# Patient Record
Sex: Male | Born: 1965 | Race: Black or African American | Hispanic: No | Marital: Single | Smoking: Former smoker
Health system: Southern US, Community
[De-identification: ages and names within clinical notes are randomized; demographics above are authoritative.]

---

## 1999-05-21 ENCOUNTER — Emergency Department (HOSPITAL_COMMUNITY): Admission: EM | Admit: 1999-05-21 | Discharge: 1999-05-21 | Payer: Self-pay | Admitting: Emergency Medicine

## 2001-07-04 ENCOUNTER — Encounter: Payer: Self-pay | Admitting: Emergency Medicine

## 2001-07-04 ENCOUNTER — Emergency Department (HOSPITAL_COMMUNITY): Admission: EM | Admit: 2001-07-04 | Discharge: 2001-07-04 | Payer: Self-pay | Admitting: Emergency Medicine

## 2006-06-27 ENCOUNTER — Emergency Department (HOSPITAL_COMMUNITY): Admission: EM | Admit: 2006-06-27 | Discharge: 2006-06-27 | Payer: Self-pay | Admitting: Emergency Medicine

## 2007-08-17 ENCOUNTER — Emergency Department (HOSPITAL_COMMUNITY): Admission: EM | Admit: 2007-08-17 | Discharge: 2007-08-17 | Payer: Self-pay | Admitting: Emergency Medicine

## 2011-04-27 ENCOUNTER — Encounter: Payer: Self-pay | Admitting: Family Medicine

## 2011-04-27 ENCOUNTER — Ambulatory Visit (INDEPENDENT_AMBULATORY_CARE_PROVIDER_SITE_OTHER): Payer: 59 | Admitting: Family Medicine

## 2011-04-27 VITALS — BP 134/82 | HR 96 | Temp 98.1°F | Resp 18 | Ht 69.0 in | Wt 146.0 lb

## 2011-04-27 DIAGNOSIS — Z0000 Encounter for general adult medical examination without abnormal findings: Secondary | ICD-10-CM | POA: Insufficient documentation

## 2011-04-27 DIAGNOSIS — Z720 Tobacco use: Secondary | ICD-10-CM | POA: Insufficient documentation

## 2011-04-27 DIAGNOSIS — L723 Sebaceous cyst: Secondary | ICD-10-CM

## 2011-04-27 NOTE — Patient Instructions (Signed)
Epidermal Cyst An epidermal cyst is usually a small, painless lump under the skin. Cysts often occur on the face, neck, stomach, chest, or genitals. The cyst may be filled with a bad smelling paste. Do not pop your cyst. Popping the cyst can cause pain and puffiness (swelling). HOME CARE   Only take medicines as told by your doctor.   Take your medicine (antibiotics) as told. Finish it even if you start to feel better.  GET HELP RIGHT AWAY IF:  Your cyst is tender, red, or puffy.   You are not getting better, or you are getting worse.   You have any questions or concerns.  MAKE SURE YOU:  Understand these instructions.   Will watch your condition.   Will get help right away if you are not doing well or get worse.  Document Released: 02/16/2004 Document Revised: 12/28/2010 Document Reviewed: 07/17/2010 Exit[employer] Patient Information 2012 [employer]. 

## 2011-04-27 NOTE — Progress Notes (Signed)
  Subjective:    Patient ID: Shane Daniel, male    DOB: 1965/04/24, 46 y.o.   MRN: [id number]  HPI  This 46 y.o. AA male is here for evaluation of a knot on the left side of his neck which has been  present for 2 years. It was looked at by another physician who told him it was a cyst that could easily be cut  open and material expressed; the pt wants the lesion to be removed permanently sothat it does not come back  He had a smaller lesion on the left side of his face near his eye years ago; he applied a topical salve to the area  with complete resolution. He has been squeezing this knot, trying to "pop it". There has been no discharge  from this lesion.    Review of Systems  Constitutional: Negative for fever and chills.  HENT: Negative for sore throat, trouble swallowing, neck pain and neck stiffness.   Skin: Negative for rash and wound.  Neurological: Negative.        Objective:   Physical Exam  Vitals reviewed. Constitutional: He is oriented to person, place, and time. He appears well-developed and well-nourished. No distress.  HENT:  Head: Normocephalic and atraumatic.  Right Ear: External ear normal.  Left Ear: External ear normal.  Mouth/Throat: Oropharynx is clear and moist.  Eyes: Conjunctivae and EOM are normal. No scleral icterus.  Neck: Normal range of motion. Neck supple. No thyromegaly present.  Cardiovascular: Normal rate.   Pulmonary/Chest: Effort normal. No respiratory distress.  Lymphadenopathy:    He has no cervical adenopathy.  Neurological: He is alert and oriented to person, place, and time.  Skin:       Left side of neck- 1.5 cm subcutaneous , easily mobile firm nodule at the angle of the jaw; no drainage          Assessment & Plan:   1. Sebaceous cyst  Ambulatory referral to Dermatology; discussed the appropriate excision of this lesion so that it does not recur; pt is established with [facility] And would like a referral to that  practice

## 2015-06-17 ENCOUNTER — Observation Stay (HOSPITAL_COMMUNITY)
Admission: EM | Admit: 2015-06-17 | Discharge: 2015-06-19 | Disposition: A | Discharge status: Home / Self Care | Payer: Self-pay | Attending: Internal Medicine | Admitting: Internal Medicine

## 2015-06-17 ENCOUNTER — Emergency Department (HOSPITAL_COMMUNITY): Payer: Self-pay

## 2015-06-17 ENCOUNTER — Encounter (HOSPITAL_COMMUNITY): Payer: Self-pay | Admitting: Emergency Medicine

## 2015-06-17 DIAGNOSIS — J449 Chronic obstructive pulmonary disease, unspecified: Secondary | ICD-10-CM | POA: Insufficient documentation

## 2015-06-17 DIAGNOSIS — F17200 Nicotine dependence, unspecified, uncomplicated: Secondary | ICD-10-CM | POA: Insufficient documentation

## 2015-06-17 DIAGNOSIS — I2699 Other pulmonary embolism without acute cor pulmonale: Principal | ICD-10-CM | POA: Diagnosis present

## 2015-06-17 DIAGNOSIS — Z720 Tobacco use: Secondary | ICD-10-CM | POA: Diagnosis present

## 2015-06-17 DIAGNOSIS — R042 Hemoptysis: Secondary | ICD-10-CM | POA: Insufficient documentation

## 2015-06-17 DIAGNOSIS — I2609 Other pulmonary embolism with acute cor pulmonale: Secondary | ICD-10-CM

## 2015-06-17 DIAGNOSIS — Z23 Encounter for immunization: Secondary | ICD-10-CM | POA: Insufficient documentation

## 2015-06-17 LAB — TROPONIN I

## 2015-06-17 LAB — TSH: TSH: 1.488 u[IU]/mL (ref 0.350–4.500)

## 2015-06-17 LAB — I-STAT CHEM 8, ED
BUN: 5 mg/dL — ABNORMAL LOW (ref 6–20)
CALCIUM ION: 1.14 mmol/L (ref 1.12–1.23)
CHLORIDE: 103 mmol/L (ref 101–111)
Creatinine, Ser: 0.7 mg/dL (ref 0.61–1.24)
GLUCOSE: 107 mg/dL — AB (ref 65–99)
HCT: 44 % (ref 39.0–52.0)
HEMOGLOBIN: 15 g/dL (ref 13.0–17.0)
Potassium: 4.4 mmol/L (ref 3.5–5.1)
SODIUM: 140 mmol/L (ref 135–145)
TCO2: 24 mmol/L (ref 0–100)

## 2015-06-17 LAB — CBC WITH DIFFERENTIAL/PLATELET
Basophils Absolute: 0 10*3/uL (ref 0.0–0.1)
Basophils Relative: 0 %
EOS ABS: 0 10*3/uL (ref 0.0–0.7)
EOS PCT: 0 %
HCT: 41.5 % (ref 39.0–52.0)
Hemoglobin: 14 g/dL (ref 13.0–17.0)
LYMPHS ABS: 1.3 10*3/uL (ref 0.7–4.0)
LYMPHS PCT: 14 %
MCH: 33.2 pg (ref 26.0–34.0)
MCHC: 33.7 g/dL (ref 30.0–36.0)
MCV: 98.3 fL (ref 78.0–100.0)
MONO ABS: 1 10*3/uL (ref 0.1–1.0)
Monocytes Relative: 11 %
Neutro Abs: 7.1 10*3/uL (ref 1.7–7.7)
Neutrophils Relative %: 75 %
PLATELETS: 395 10*3/uL (ref 150–400)
RBC: 4.22 MIL/uL (ref 4.22–5.81)
RDW: 12.2 % (ref 11.5–15.5)
WBC: 9.4 10*3/uL (ref 4.0–10.5)

## 2015-06-17 LAB — MRSA PCR SCREENING: MRSA BY PCR: NEGATIVE

## 2015-06-17 LAB — FIBRINOGEN: Fibrinogen: >800 mg/dL — ABNORMAL HIGH (ref 204–475)

## 2015-06-17 LAB — PLATELET COUNT: PLATELETS: 433 10*3/uL — AB (ref 150–400)

## 2015-06-17 LAB — PSA: PSA: 1.15 ng/mL (ref 0.00–4.00)

## 2015-06-17 LAB — PHOSPHORUS: PHOSPHORUS: 2.8 mg/dL (ref 2.5–4.6)

## 2015-06-17 LAB — HEMOGLOBIN AND HEMATOCRIT, BLOOD
HCT: 43.5 % (ref 39.0–52.0)
Hemoglobin: 15.1 g/dL (ref 13.0–17.0)

## 2015-06-17 LAB — APTT: APTT: 33 s (ref 24–37)

## 2015-06-17 LAB — PROTIME-INR
INR: 1.14 (ref 0.00–1.49)
PROTHROMBIN TIME: 14.8 s (ref 11.6–15.2)

## 2015-06-17 LAB — MAGNESIUM: MAGNESIUM: 2.2 mg/dL (ref 1.7–2.4)

## 2015-06-17 MED ORDER — ACETAMINOPHEN 650 MG RE SUPP: 650.0000 mg | Freq: Four times a day (QID) | RECTAL | Status: DC | PRN

## 2015-06-17 MED ORDER — ACETAMINOPHEN 325 MG PO TABS: 650.0000 mg | ORAL_TABLET | Freq: Four times a day (QID) | ORAL | Status: DC | PRN

## 2015-06-17 MED ORDER — SODIUM CHLORIDE 0.9% FLUSH
3.0000 mL | Freq: Two times a day (BID) | INTRAVENOUS | Status: DC
  Administered 2015-06-18: 3 mL via INTRAVENOUS

## 2015-06-17 MED ORDER — PNEUMOCOCCAL VAC POLYVALENT 25 MCG/0.5ML IJ INJ
0.5000 mL | INJECTION | INTRAMUSCULAR | Status: DC
  Filled 2015-06-17: qty 0.5

## 2015-06-17 MED ORDER — ENOXAPARIN SODIUM 60 MG/0.6ML ~~LOC~~ SOLN
60.0000 mg | Freq: Two times a day (BID) | SUBCUTANEOUS | Status: DC
  Administered 2015-06-18: 60 mg via SUBCUTANEOUS
  Filled 2015-06-17 (×2): qty 0.6

## 2015-06-17 MED ORDER — NICOTINE 21 MG/24HR TD PT24
21.0000 mg | MEDICATED_PATCH | Freq: Every day | TRANSDERMAL | Status: DC
  Administered 2015-06-17 – 2015-06-18 (×2): 21 mg via TRANSDERMAL
  Filled 2015-06-17 (×3): qty 1

## 2015-06-17 MED ORDER — OXYCODONE HCL 5 MG PO TABS: 5.0000 mg | ORAL_TABLET | ORAL | Status: DC | PRN

## 2015-06-17 MED ORDER — ONDANSETRON HCL 4 MG PO TABS: 4.0000 mg | ORAL_TABLET | Freq: Four times a day (QID) | ORAL | Status: DC | PRN

## 2015-06-17 MED ORDER — IOPAMIDOL (ISOVUE-370) INJECTION 76%
INTRAVENOUS | Status: AC
  Administered 2015-06-17: 100 mL
  Filled 2015-06-17: qty 100

## 2015-06-17 MED ORDER — ENOXAPARIN SODIUM 60 MG/0.6ML ~~LOC~~ SOLN
60.0000 mg | Freq: Once | SUBCUTANEOUS | Status: AC
  Administered 2015-06-17: 60 mg via SUBCUTANEOUS
  Filled 2015-06-17: qty 0.6

## 2015-06-17 MED ORDER — ONDANSETRON HCL 4 MG/2ML IJ SOLN: 4.0000 mg | Freq: Four times a day (QID) | INTRAMUSCULAR | Status: DC | PRN

## 2015-06-17 NOTE — ED Notes (Signed)
Pt returned from CT °

## 2015-06-17 NOTE — H&P (Signed)
History and Physical    Shane Daniel EAV:[id number] DOB: 03/08/1965 DOA: 06/17/2015  PCP: No primary care provider on file. Gentry Fitz  Patient coming from/Resides with: Private residence/lives with mother  Chief Complaint: Chest, left shoulder and flank pain  HPI: Shane Daniel is a 50 y.o. male with medical history significant for regular tobacco use and episodic alcohol use who presented to the ER for abrupt onset of sharp left shoulder radiating to the flank and chest (and increased with taking a deep breath) pain. Today he began having some mild hemoptysis and because the pain was significant he presented to the ER for treatment. He has not had any infectious symptoms such as fevers or chills. He has not had any lower extremity swelling. He has not had any shortness of breath or dyspnea on exertion or orthopnea. He has not taken any long automobile trips, traveled via airplane or had any surgeries. He has been having some nonspecific abdominal pain recently and has been taking Aleve for this.  ED Course:  PO 98.7-130/95-111-18-room air saturations 99% 2 view chest x-ray: Posterior left lower lobe infiltrate question pneumonia though alveolar hemorrhage can have similar appearance CT of the chest with and without contrast PE protocol: Acute pulmonary emboli in the distal left main pulmonary artery and left lower lobe pulmonary arteries including extensive left lower lobe pulmonary emboli including long segments of multiple left lower lobe branches with CT evidence of right heart strain-radiologist describes this as submassive PE. In addition there is a left lower lobe pulmonary infarction and a small left pleural effusion. There are also linear filling defects in the distal right main pulmonary artery and adjacent branches with an appearance compatible with subacute or chronic pulmonary emboli Full dose Lovenox injections 60 mg subcutaneous 1 in the ER  Review of Systems:  In addition  to the HPI above,  No Fever-chills, myalgias or other constitutional symptoms No Headache, changes with Vision or hearing, new weakness, tingling, numbness in any extremity, No problems swallowing food or Liquids, indigestion/reflux No Cough or Shortness of Breath, palpitations, orthopnea or DOE No N/V; no melena or hematochezia, no dark tarry stools,  No dysuria, hematuria or flank pain No new skin rashes, lesions, masses or bruises, No new joints pains-aches No recent weight gain or loss No polyuria, polydypsia or polyphagia,   History reviewed. No pertinent past medical history.  History reviewed. No pertinent past surgical history.   reports that he has been smoking Cigarettes.  He has been smoking about 1.00 pack per day. He does not have any smokeless tobacco history on file. He reports that he drinks alcohol. He reports that he does not use illicit drugs.  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 Patient denies family history of prostate cancer, known coagulopathies or known history of any family members with DVT or pulmonary embolism  Prior to Admission medications   Medication Sig Start Date End Date Taking? Authorizing Provider  Naproxen Sodium (ALEVE) 220 MG CAPS Take 220 mg by mouth 4 (four) times daily as needed (pain).   Yes Historical Provider, MD    Physical Exam: Filed Vitals:   06/17/15 1615 06/17/15 1622 06/17/15 1630 06/17/15 1645  BP: 140/92  135/97 137/90  Pulse: 86  85 87  Temp:  TempSrc:      Resp: 19  22 24   Height:  5\' 8"  (1.727 m)    Weight:  140 lb (63.504 kg)    SpO2: 99%  100% 100%      Constitutional: NAD, calm, comfortable Eyes: PERRL, lids and conjunctivae normal ENMT: Mucous membranes are moist. Posterior pharynx clear of any  exudate or lesions.Normal dentition.  Neck: normal, supple, no masses, no thyromegaly Respiratory: clear to auscultation bilaterally, no wheezing, no crackles. Normal respiratory effort. No accessory muscle use.  Cardiovascular: Regular rate and rhythm, no murmurs / rubs / gallops. No extremity edema. 2+ pedal pulses. No carotid bruits.  Abdomen: no tenderness, no masses palpated. No hepatosplenomegaly. Bowel sounds positive.  Musculoskeletal: no clubbing / cyanosis. No joint deformity upper and lower extremities. Good ROM, no contractures. Normal muscle tone.  Skin: no rashes, lesions, ulcers. No induration Neurologic: CN 2-12 grossly intact. Sensation intact, DTR normal. Strength 5/5 x all 4 extremities.  Psychiatric: Normal judgment and insight. Alert and oriented x 3. Normal mood.    Labs on Admission: I have personally reviewed following labs and imaging studies  CBC:  Recent Labs Lab 06/17/15 1346 06/17/15 1357  WBC 9.4  --   NEUTROABS 7.1  --   HGB 14.0 15.0  HCT 41.5 44.0  MCV 98.3  --   PLT 395  --    Basic Metabolic Panel:  Recent Labs Lab 06/17/15 1357  NA 140  K 4.4  CL 103  GLUCOSE 107*  BUN 5*  CREATININE 0.70   GFR: Estimated Creatinine Clearance: 98.1 mL/min (by C-G formula based on Cr of 0.7). Liver Function Tests: No results for input(s): AST, ALT, ALKPHOS, BILITOT, PROT, ALBUMIN in the last 168 hours. No results for input(s): LIPASE, AMYLASE in the last 168 hours. No results for input(s): AMMONIA in the last 168 hours. Coagulation Profile: No results for input(s): INR, PROTIME in the last 168 hours. Cardiac Enzymes: No results for input(s): CKTOTAL, CKMB, CKMBINDEX, TROPONINI in the last 168 hours. BNP (last 3 results) No results for input(s): PROBNP in the last 8760 hours. HbA1C: No results for input(s): HGBA1C in the last 72 hours. CBG: No results for input(s): GLUCAP in the last 168 hours. Lipid Profile: No results for input(s): CHOL, HDL,  LDLCALC, TRIG, CHOLHDL, LDLDIRECT in the last 72 hours. Thyroid Function Tests: No results for input(s): TSH, T4TOTAL, FREET4, T3FREE, THYROIDAB in the last 72 hours. Anemia Panel: No results for input(s): VITAMINB12, FOLATE, FERRITIN, TIBC, IRON, RETICCTPCT in the last 72 hours. Urine analysis: No results found for: COLORURINE, APPEARANCEUR, LABSPEC, PHURINE, GLUCOSEU, HGBUR, BILIRUBINUR, KETONESUR, PROTEINUR, UROBILINOGEN, NITRITE, LEUKOCYTESUR Sepsis Labs: @LABRCNTIP (procalcitonin:4,lacticidven:4) )No results found for this or any previous visit (from the past 240 hour(s)).   Radiological Exams on Admission: Dg Chest 2 View  06/17/2015  CLINICAL DATA:  LEFT posterior lower chest pain for a week, today coughed up some blood, smoker, initial encounter EXAM: CHEST  2 VIEW COMPARISON:  None available FINDINGS: Normal heart size, mediastinal contours, and pulmonary vascularity. Retrocardiac LEFT lower lobe opacity posteriorly. Remaining lungs clear. No pleural effusion or pneumothorax. Bones unremarkable. IMPRESSION: Posterior LEFT lower lobe infiltrate question pneumonia though alveolar hemorrhage can have a similar appearance. Followup radiographs recommended in 2-3 weeks to ensure resolution and exclude underlying mass/tumor. Electronically Signed   By: Ulyses Southward M.D.   On: 06/17/2015 13:23   Ct Angio Chest Pe W/cm &/or Wo Cm  06/17/2015  CLINICAL DATA:  Left chest pain for the  past 5 days. Hemoptysis for the past 2 days. EXAM: CT ANGIOGRAPHY CHEST WITH CONTRAST TECHNIQUE: Multidetector CT imaging of the chest was performed using the standard protocol during bolus administration of intravenous contrast. Multiplanar CT image reconstructions and MIPs were obtained to evaluate the vascular anatomy. CONTRAST:  80 cc Isovue 370 COMPARISON:  Chest radiographs obtained earlier today. FINDINGS: Mediastinum/Lymph Nodes: Pulmonary arterial filling defects in the distal left main pulmonary artery and at  multiple left lower lobe pulmonary arteries. These are causing long segment complete occlusion of several left lower lobe pulmonary arteries. There are also more linear appearing filling defects in the distal right main pulmonary artery and adjacent branches. The right ventricular to left ventricular ratio is 0.97. No enlarged lymph nodes. Lungs/Pleura: Small left pleural effusion. Patchy opacity in the inferior left lower lobe. Mild diffuse bilateral centrilobular and paraseptal emphysema. Upper abdomen: No acute findings. Musculoskeletal: Minimal thoracic spine degenerative changes. Review of the MIP images confirms the above findings. IMPRESSION: 1. Acute pulmonary emboli in the distal left main pulmonary artery and left lower lobe pulmonary arteries including extensive left lower lobe pulmonary emboli occluding long segments of multiple left lower lobe branches with CT evidence of right heart strain (RV/LV Ratio = 0.97) consistent with at least submassive (intermediate risk) PE. The presence of right heart strain has been associated with an increased risk of morbidity and mortality. Please activate Code PE by paging [phone]. 2. Left lower lobe pulmonary infarction. 3. Small left pleural effusion. 4. Linear filling defects in the distal right main pulmonary artery and adjacent branches, with an appearance compatible with subacute or chronic pulmonary emboli. 5. Mild centrilobular and paraseptal emphysema. Critical Value/emergent results were called by telephone at the time of interpretation on 06/17/2015 at 4:01 pm to Dr. Marily Memos ON, who verbally acknowledged these results. Electronically Signed   By: Beckie Salts M.D.   On: 06/17/2015 16:04    EKG: (Independently reviewed) sinus rhythm with ventricular rate 89 bpm, QTC 455 ms, no ECG evidence of RV strain and no definitive ischemic changes although patient does have somewhat flattened and slightly elevated ST segments in V5 and V6 which are  nonspecific finding since they are less than 0.04 in depth and I have no old EKGs for comparison  Assessment/Plan Principal Problem:   Left pulmonary embolus  -Based on current history seems to be unprovoked; significant left PE with CT evidence of RV strain with associated pulmonary infarct but surprisingly not hypoxemic at rest or with ambulation (pulse ox remained 97% without reports of chest pain or shortness of breath) -Because of CT evidence of possible RV strain and degree of left-sided PE we'll cycle troponins and check an echocardiogram -Currently hemodynamically stable and no evidence of hypotension which will be further suggestive of significant RV strain -Full dose anticoagulation with Lovenox -Due to patient's changing of jobs will need to determine whether warfarin versus NOAC would be most appropriate AC to use after dc -Coagulopathy panel ordered in ER after given Lovenox -Given patient's age and ethnic background will check PSA to screen for possible prostatic malignancy  -Patient complaining of nonspecific abdominal pain and was treating with NSAIDs and will check fecal occult blood and monitor for GI bleeding with initiation of anticoagulation -No further hemoptysis since arrival to ER -Check bilateral lower extremity venous duplex  Active Problems:   Tobacco abuse/COPD (chronic obstructive pulmonary disease)  -Counseled regarding cessation -No pulmonary masses noted on CT of the chest -Nicotine patch  Regular alcohol use -Patient reports that he can drink up to 2 beers per day and more on the weekends including liquor -Last drink was 3 days ago and currently is not experiencing any withdrawal symptoms    DVT prophylaxis: Lovenox Code Status: Full code Family Communication: No family at bedside Disposition Plan: Anticipate discharge back to preadmission home environment once medically stable and decision regarding anticoagulation determined Consults called: None    Admission status: Observation/telemetry    Woodrow Dulski L. ANP-BC [facility] Pager [phone]   If 7PM-7AM, please contact night-coverage [email or web address] Password [email or web address]  06/17/2015, 5:02 PM

## 2015-06-17 NOTE — ED Provider Notes (Signed)
CSN: [id number]     Arrival date & time 06/17/15  1029 History   First MD Initiated Contact with Patient 06/17/15 1217     Chief Complaint  Patient presents with  . Flank Pain      Patient is a 50 y.o. male presenting with flank pain. The history is provided by the patient.  Flank Pain This is a new problem. Associated symptoms include chest pain. Pertinent negatives include no shortness of breath.  Patient's had pain in his left shoulder posteriorly that went down to his left flank area. Had it for the last couple days. No difficulty breathing. Does not come on with exertion. Comes on with certain movements. Now the left lower chest first and the shoulder longer hurts. No specific muscular activity that brought it on initially. No fevers or chills. No diaphoresis. No dysuria. He has had previous pain in his left hip but never pain in this area.  History reviewed. No pertinent past medical history. History reviewed. No pertinent past surgical history. No family history on file. Social History  Substance Use Topics  . Smoking status: Current Every Day Smoker -- 1.00 packs/day    Types: Cigarettes  . Smokeless tobacco: None  . Alcohol Use: Yes     Comment: 2 beers /day    Review of Systems  Constitutional: Negative for diaphoresis.  HENT: Negative for congestion.   Respiratory: Negative for shortness of breath.   Cardiovascular: Positive for chest pain.  Genitourinary: Positive for flank pain.  Musculoskeletal: Positive for back pain.       Left shoulder pain.  Skin: Negative for wound.  Neurological: Negative for seizures.      Allergies  Review of patient's allergies indicates no known allergies.  Home Medications   Prior to Admission medications   Medication Sig Start Date End Date Taking? Authorizing Provider  Naproxen Sodium (ALEVE) 220 MG CAPS Take 220 mg by mouth 4 (four) times daily as needed (pain).   Yes Historical Provider, MD   BP 133/89 mmHg  Pulse 89   Temp(Src) 98.7 F (37.1 C) (Oral)  Resp 23  SpO2 99% Physical Exam  Constitutional: He appears well-developed.  HENT:  Head: Atraumatic.  Neck: Neck supple.  Cardiovascular: Normal rate.   Pulmonary/Chest: Effort normal.  Abdominal: Soft.  Musculoskeletal: Normal range of motion.  Slight tenderness to left back just inferior to scapula. No rash. Range of motion intact. Pain worsened with twisting.  Neurological: He is alert.  Skin: Skin is warm.  Psychiatric: He has a normal mood and affect.    ED Course  Procedures (including critical care time) Labs Review Labs Reviewed  I-STAT CHEM 8, ED - Abnormal; Notable for the following:    BUN 5 (*)    Glucose, Bld 107 (*)    All other components within normal limits  CBC WITH DIFFERENTIAL/PLATELET    Imaging Review Dg Chest 2 View  06/17/2015  CLINICAL DATA:  LEFT posterior lower chest pain for a week, today coughed up some blood, smoker, initial encounter EXAM: CHEST  2 VIEW COMPARISON:  None available FINDINGS: Normal heart size, mediastinal contours, and pulmonary vascularity. Retrocardiac LEFT lower lobe opacity posteriorly. Remaining lungs clear. No pleural effusion or pneumothorax. Bones unremarkable. IMPRESSION: Posterior LEFT lower lobe infiltrate question pneumonia though alveolar hemorrhage can have a similar appearance. Followup radiographs recommended in 2-3 weeks to ensure resolution and exclude underlying mass/tumor. Electronically Signed   By: Ulyses Southward M.D.   On: 06/17/2015 13:23  I have personally reviewed and evaluated these images and lab results as part of my medical decision-making.   EKG Interpretation   Date/Time:  Friday Jun 17 2015 12:37:48 EDT Ventricular Rate:  89 PR Interval:  171 QRS Duration: 91 QT Interval:  374 QTC Calculation: 455 R Axis:   67 Text Interpretation:  Sinus rhythm Probable anteroseptal infarct, old  Minimal ST depression Confirmed by Rubin PayorPICKERING  MD, Jasmina Gendron [phone]([id number]) on  06/17/2015  2:50:40 PM      MDM   Final diagnoses:  None    Patient with left flank pain. Has had an occasional cough is really unchanged. Worse with movements. X-ray showed left lower lobe infiltrate versus hemorrhage. Has not been having fevers or chills. CT angiography to be done. If shows infection give antibiotics. If tumor likely needs admission.    Benjiman CoreNathan Faelyn Sigler, MD 06/17/15 [phone]

## 2015-06-17 NOTE — ED Notes (Signed)
Pt was able to ambulate with no problems. Pt O2 was 97% and pulse stayed at 102 the whole time. Pt has no complaints at this time.

## 2015-06-17 NOTE — ED Notes (Signed)
Pt c/o pain in left posterior rib area-- also states started coughing up mucus with blood present. Has been taking aleve for a few days. No resp distress-- c/o pain with resp intermittently.

## 2015-06-17 NOTE — Plan of Care (Signed)
Problem: Education: Goal: Knowledge of [facility] Education information/materials will improve Outcome: Progressing Patient oriented to room and unit procedures, verbalized understanding.

## 2015-06-17 NOTE — ED Notes (Signed)
Patient undressed, in gown, on continuous pulse oximetry and blood pressure cuff 

## 2015-06-17 NOTE — Progress Notes (Signed)
ANTICOAGULATION CONSULT NOTE - Initial Consult  Pharmacy Consult for Lovenox Indication: pulmonary embolus  No Known Allergies  Patient Measurements: Height: 5\' 8"  (172.7 cm) Weight: 127 lb 14.4 oz (58.015 kg) IBW/kg (Calculated) : 68.4 Heparin Dosing Weight: 58 kg  Vital Signs: Temp: 98.9 F (37.2 C) (05/26 1749) Temp Source: Oral (05/26 1749) BP: 142/91 mmHg (05/26 1749) Pulse Rate: 82 (05/26 1749)  Labs:  Recent Labs  06/17/15 1346 06/17/15 1357 06/17/15 1644  HGB 14.0 15.0 15.1  HCT 41.5 44.0 43.5  PLT 395  --  433*  APTT  --   --  33  LABPROT  --   --  14.8  INR  --   --  1.14  CREATININE  --  0.70  --   TROPONINI  --   --  <0.03    Estimated Creatinine Clearance: 89.6 mL/min (by C-G formula based on Cr of 0.7).   Medical History: History reviewed. No pertinent past medical history.  Medications:  Prescriptions prior to admission  Medication Sig Dispense Refill Last Dose  . Naproxen Sodium (ALEVE) 220 MG CAPS Take 220 mg by mouth 4 (four) times daily as needed (pain).   Past Week at Unknown time    Assessment: 50 yo male with no significant PMH admitted with new PE with mild right heart strain.  Pharmacy asked to begin Lovenox for anticoagulation.  Received 1 dose of 60 mg in ED at 1730 PM.  Scr 0.7, est Crcl ~ 90 ml/min.  Goal of Therapy:  Anti-Xa level 0.6-1 units/ml 4hrs after LMWH dose given Monitor platelets by anticoagulation protocol: Yes   Plan:  1. Lovenox 60 mg sq q 12 hrs. 2. CBC q 72 hrs while on Lovenox. 3. F/u transition to oral anticoagulation soon.  Tad MooreJessica Alishah Schulte, Pharm D, BCPS  Clinical Pharmacist Pager [phone]  06/17/2015 6:04 PM

## 2015-06-18 ENCOUNTER — Observation Stay (HOSPITAL_BASED_OUTPATIENT_CLINIC_OR_DEPARTMENT_OTHER): Payer: Self-pay

## 2015-06-18 DIAGNOSIS — I2699 Other pulmonary embolism without acute cor pulmonale: Principal | ICD-10-CM

## 2015-06-18 DIAGNOSIS — Z720 Tobacco use: Secondary | ICD-10-CM

## 2015-06-18 LAB — COMPREHENSIVE METABOLIC PANEL
ALT: 40 U/L (ref 17–63)
ANION GAP: 7 (ref 5–15)
AST: 45 U/L — ABNORMAL HIGH (ref 15–41)
Albumin: 2.6 g/dL — ABNORMAL LOW (ref 3.5–5.0)
Alkaline Phosphatase: 71 U/L (ref 38–126)
BUN: 8 mg/dL (ref 6–20)
CHLORIDE: 103 mmol/L (ref 101–111)
CO2: 25 mmol/L (ref 22–32)
Calcium: 8.8 mg/dL — ABNORMAL LOW (ref 8.9–10.3)
Creatinine, Ser: 0.77 mg/dL (ref 0.61–1.24)
GFR calc non Af Amer: >60 mL/min (ref >60)
Glucose, Bld: 97 mg/dL (ref 65–99)
Potassium: 4.1 mmol/L (ref 3.5–5.1)
SODIUM: 135 mmol/L (ref 135–145)
Total Bilirubin: 1.2 mg/dL (ref 0.3–1.2)
Total Protein: 6.4 g/dL — ABNORMAL LOW (ref 6.5–8.1)

## 2015-06-18 LAB — CBC
HEMATOCRIT: 39 % (ref 39.0–52.0)
HEMOGLOBIN: 13.1 g/dL (ref 13.0–17.0)
MCH: 33.2 pg (ref 26.0–34.0)
MCHC: 33.6 g/dL (ref 30.0–36.0)
MCV: 98.7 fL (ref 78.0–100.0)
Platelets: 460 10*3/uL — ABNORMAL HIGH (ref 150–400)
RBC: 3.95 MIL/uL — AB (ref 4.22–5.81)
RDW: 12.2 % (ref 11.5–15.5)
WBC: 9.5 10*3/uL (ref 4.0–10.5)

## 2015-06-18 LAB — ECHOCARDIOGRAM COMPLETE
Height: 68 in
Weight: 2046.4 oz

## 2015-06-18 LAB — ANTITHROMBIN III: ANTITHROMB III FUNC: 96 % (ref 75–120)

## 2015-06-18 LAB — TROPONIN I: Troponin I: <0.03 ng/mL (ref <0.031)

## 2015-06-18 MED ORDER — RIVAROXABAN 15 MG PO TABS: 15.0000 mg | ORAL_TABLET | Freq: Two times a day (BID) | ORAL | Status: DC

## 2015-06-18 MED ORDER — RIVAROXABAN 20 MG PO TABS: 20.0000 mg | ORAL_TABLET | Freq: Every day | ORAL | Status: DC

## 2015-06-18 MED ORDER — RIVAROXABAN 15 MG PO TABS
15.0000 mg | ORAL_TABLET | Freq: Two times a day (BID) | ORAL | Status: DC
  Administered 2015-06-18 – 2015-06-19 (×2): 15 mg via ORAL
  Filled 2015-06-18 (×2): qty 1

## 2015-06-18 NOTE — Progress Notes (Signed)
[facility]                                                                              Patient Demographics  Shane Daniel, is a 50 y.o. male, DOB - 24-Oct-1965, ZOX:[id number]  Admit date - 06/17/2015   Admitting Physician Esperanza SheetsUlugbek N Buriev, MD  Outpatient Primary MD for the patient is No primary care provider on file.  Outpatient specialists:   LOS - 1  days    Chief Complaint  Patient presents with  . Flank Pain       Brief summary  Patient is a 50 year old male with tobacco abuse, episodic alcohol use, presented with abrupt onset of sharp left shoulder pain radiating to the flank and chest, pleuritic. He also had some mild hemoptysis. CT of the chest showed submassive PE in the distal left main pulmonary artery and left lower lobe pulmonary arteries with right heart strain. Patient was admitted for further workup.    Assessment & Plan    Principal Problem:  Left pulmonary embolus , Submassive -Unclear etiology, no family history or long distance travels or period of immobility, check hypercoagulable panel. PSA within normal range 1.15. Patient also recommended screening colonoscopy, has not had a colonoscopy yet - Patient was started on therapeutic Lovenox. Patient states he does not have insurance coverage at this time and is in between jobs, he will know his benefits on Wednesday, May 31. Started patient on xarelto as he can obtain 30 day free card with case management assistance. Subsequently he will need to have follow-up in community wellness center versus PCP, will need preauthorization for further xarelto.  - Follow 2-D echo, lower ext Dopplers  Active Problems:  Tobacco abuse/COPD (chronic obstructive pulmonary disease)  -Counseled regarding cessation -No pulmonary masses noted on CT of the chest -Nicotine patch   Regular alcohol use -Patient reports that he can drink up to 2 beers per day and more on the weekends including liquor -Last  drink was 3 days ago and currently is not experiencing any withdrawal symptoms  Code Status: full code   DVT Prophylaxis:  Lovenox    Family Communication: Discussed in detail with the patient, all imaging results, lab results explained to the patient   Disposition Plan:   Time Spent in minutes   25 minutes  Procedures:  CTA chest  Consultants:   None   Antimicrobials:   None    Medications  Scheduled Meds: . nicotine  21 mg Transdermal Daily  . pneumococcal 23 valent vaccine  0.5 mL Intramuscular Tomorrow-1000  . Rivaroxaban  15 mg Oral BID WC  . [START ON 07/09/2015] rivaroxaban  20 mg Oral Q supper  . sodium chloride flush  3 mL Intravenous Q12H   Continuous Infusions:  PRN Meds:.acetaminophen **OR** acetaminophen, ondansetron **OR** ondansetron (ZOFRAN) IV, oxyCODONE   Antibiotics   Anti-infectives    None        Subjective:   Shane MelterSteven Daniel was seen and examined today. Chest pain is improving. Patient denies dizziness, shortness of breath, abdominal pain, N/V/D/C, new weakness, numbess, tingling. No acute events overnight.    Objective:   Filed Vitals:  06/17/15 1730 06/17/15 1749 06/17/15 2017 06/18/15 0440  BP: 131/90 142/91 126/84 119/77  Pulse: 88 82 97 92  Temp:  98.9 F (37.2 C) 99.3 F (37.4 C) 98.7 F (37.1 C)  TempSrc:  Oral Oral Oral  Resp:  20 20 18   Height:  5\' 8"  (1.727 m) 5\' 8"  (1.727 m)   Weight:  58.015 kg (127 lb 14.4 oz)    SpO2: 100% 100% 100% 99%    Intake/Output Summary (Last 24 hours) at 06/18/15 1028 Last data filed at 06/17/15 1819  Gross per 24 hour  Intake    240 ml  Output      0 ml  Net    240 ml     Wt Readings from Last 3 Encounters:  06/17/15 58.015 kg (127 lb 14.4 oz)  04/27/11 66.225 kg (146 lb)     Exam  General: Alert and oriented x 3, NAD  HEENT:  PERRLA, EOMI, Anicteric Sclera, mucous membranes moist.   Neck: Supple, no JVD, no masses  Cardiovascular: S1 S2 auscultated, no rubs, murmurs  or gallops. Regular rate and rhythm.  Respiratory: Clear to auscultation bilaterally, no wheezing, rales or rhonchi  Gastrointestinal: Soft, nontender, nondistended, + bowel sounds  Ext: no cyanosis clubbing or edema  Neuro: AAOx3, Cr N's II- XII. Strength 5/5 upper and lower extremities bilaterally  Skin: No rashes  Psych: Normal affect and demeanor, alert and oriented x3    Data Reviewed:  I have personally reviewed following labs and imaging studies  Micro Results Recent Results (from the past 240 hour(s))  MRSA PCR Screening     Status: None   Collection Time: 06/17/15  6:26 PM  Result Value Ref Range Status   MRSA by PCR NEGATIVE NEGATIVE Final    Comment:        The GeneXpert MRSA Assay (FDA approved for NASAL specimens only), is one component of a comprehensive MRSA colonization surveillance program. It is not intended to diagnose MRSA infection nor to guide or monitor treatment for MRSA infections.     Radiology Reports Dg Chest 2 View  06/17/2015  CLINICAL DATA:  LEFT posterior lower chest pain for a week, today coughed up some blood, smoker, initial encounter EXAM: CHEST  2 VIEW COMPARISON:  None available FINDINGS: Normal heart size, mediastinal contours, and pulmonary vascularity. Retrocardiac LEFT lower lobe opacity posteriorly. Remaining lungs clear. No pleural effusion or pneumothorax. Bones unremarkable. IMPRESSION: Posterior LEFT lower lobe infiltrate question pneumonia though alveolar hemorrhage can have a similar appearance. Followup radiographs recommended in 2-3 weeks to ensure resolution and exclude underlying mass/tumor. Electronically Signed   By: Ulyses Southward M.D.   On: 06/17/2015 13:23   Ct Angio Chest Pe W/cm &/or Wo Cm  06/17/2015  CLINICAL DATA:  Left chest pain for the past 5 days. Hemoptysis for the past 2 days. EXAM: CT ANGIOGRAPHY CHEST WITH CONTRAST TECHNIQUE: Multidetector CT imaging of the chest was performed using the standard protocol  during bolus administration of intravenous contrast. Multiplanar CT image reconstructions and MIPs were obtained to evaluate the vascular anatomy. CONTRAST:  80 cc Isovue 370 COMPARISON:  Chest radiographs obtained earlier today. FINDINGS: Mediastinum/Lymph Nodes: Pulmonary arterial filling defects in the distal left main pulmonary artery and at multiple left lower lobe pulmonary arteries. These are causing long segment complete occlusion of several left lower lobe pulmonary arteries. There are also more linear appearing filling defects in the distal right main pulmonary artery and adjacent branches. The right ventricular to left  ventricular ratio is 0.97. No enlarged lymph nodes. Lungs/Pleura: Small left pleural effusion. Patchy opacity in the inferior left lower lobe. Mild diffuse bilateral centrilobular and paraseptal emphysema. Upper abdomen: No acute findings. Musculoskeletal: Minimal thoracic spine degenerative changes. Review of the MIP images confirms the above findings. IMPRESSION: 1. Acute pulmonary emboli in the distal left main pulmonary artery and left lower lobe pulmonary arteries including extensive left lower lobe pulmonary emboli occluding long segments of multiple left lower lobe branches with CT evidence of right heart strain (RV/LV Ratio = 0.97) consistent with at least submassive (intermediate risk) PE. The presence of right heart strain has been associated with an increased risk of morbidity and mortality. Please activate Code PE by paging [phone]. 2. Left lower lobe pulmonary infarction. 3. Small left pleural effusion. 4. Linear filling defects in the distal right main pulmonary artery and adjacent branches, with an appearance compatible with subacute or chronic pulmonary emboli. 5. Mild centrilobular and paraseptal emphysema. Critical Value/emergent results were called by telephone at the time of interpretation on 06/17/2015 at 4:01 pm to Dr. Marily Memos ON, who verbally acknowledged  these results. Electronically Signed   By: Beckie Salts M.D.   On: 06/17/2015 16:04    Lab Data:  CBC:  Recent Labs Lab 06/17/15 1346 06/17/15 1357 06/17/15 1644 06/18/15 0427  WBC 9.4  --   --  9.5  NEUTROABS 7.1  --   --   --   HGB 14.0 15.0 15.1 13.1  HCT 41.5 44.0 43.5 39.0  MCV 98.3  --   --  98.7  PLT 395  --  433* 460*   Basic Metabolic Panel:  Recent Labs Lab 06/17/15 1357 06/17/15 2133 06/18/15 0427  NA 140  --  135  K 4.4  --  4.1  CL 103  --  103  CO2  --   --  25  GLUCOSE 107*  --  97  BUN 5*  --  8  CREATININE 0.70  --  0.77  CALCIUM  --   --  8.8*  MG  --  2.2  --   PHOS  --  2.8  --    GFR: Estimated Creatinine Clearance: 89.6 mL/min (by C-G formula based on Cr of 0.77). Liver Function Tests:  Recent Labs Lab 06/18/15 0427  AST 45*  ALT 40  ALKPHOS 71  BILITOT 1.2  PROT 6.4*  ALBUMIN 2.6*   No results for input(s): LIPASE, AMYLASE in the last 168 hours. No results for input(s): AMMONIA in the last 168 hours. Coagulation Profile:  Recent Labs Lab 06/17/15 1644  INR 1.14   Cardiac Enzymes:  Recent Labs Lab 06/17/15 1644 06/17/15 2133 06/18/15 0427  TROPONINI <0.03 <0.03 <0.03   BNP (last 3 results) No results for input(s): PROBNP in the last 8760 hours. HbA1C: No results for input(s): HGBA1C in the last 72 hours. CBG: No results for input(s): GLUCAP in the last 168 hours. Lipid Profile: No results for input(s): CHOL, HDL, LDLCALC, TRIG, CHOLHDL, LDLDIRECT in the last 72 hours. Thyroid Function Tests:  Recent Labs  06/17/15 1826  TSH 1.488   Anemia Panel: No results for input(s): VITAMINB12, FOLATE, FERRITIN, TIBC, IRON, RETICCTPCT in the last 72 hours. Urine analysis: No results found for: COLORURINE, APPEARANCEUR, LABSPEC, PHURINE, GLUCOSEU, HGBUR, BILIRUBINUR, KETONESUR, PROTEINUR, UROBILINOGEN, NITRITE, Hurshel Party M.D. [facility] 06/18/2015, 10:28 AM  Pager: [phone] Between 7am to  7pm - call Pager - [phone]  After 7pm go to [email or web address] -  password [email or web address]  Call night coverage person covering after 7pm

## 2015-06-18 NOTE — Progress Notes (Signed)
ANTICOAGULATION CONSULT NOTE - Initial Consult  Pharmacy Consult for Rivaroxaban Indication: pulmonary embolus  No Known Allergies  Patient Measurements: Height: 5\' 8"  (172.7 cm) Weight: 127 lb 14.4 oz (58.015 kg) IBW/kg (Calculated) : 68.4  Vital Signs: Temp: 98.7 F (37.1 C) (05/27 0440) Temp Source: Oral (05/27 0440) BP: 119/77 mmHg (05/27 0440) Pulse Rate: 92 (05/27 0440)  Labs:  Recent Labs  06/17/15 1346 06/17/15 1357 06/17/15 1644 06/17/15 2133 06/18/15 0427  HGB 14.0 15.0 15.1  --  13.1  HCT 41.5 44.0 43.5  --  39.0  PLT 395  --  433*  --  460*  APTT  --   --  33  --   --   LABPROT  --   --  14.8  --   --   INR  --   --  1.14  --   --   CREATININE  --  0.70  --   --  0.77  TROPONINI  --   --  <0.03 <0.03 <0.03    Estimated Creatinine Clearance: 89.6 mL/min (by C-G formula based on Cr of 0.77).   Medical History: History reviewed. No pertinent past medical history.  Assessment:  50 yo male with no significant PMH admitted with new PE with mild right heart strain. Pt was started on Lovenox for PE and now pharmacy consulted to transition to Xarelto. CrCl ~ 90. CBC stable.  Goal of Therapy:  Monitor platelets by anticoagulation protocol: Yes   Plan:  Xarelto po 15 mg BID x 21 days, then Xarelto 20mg  daily Monitor CBC, and for S&S of bleed  Sandi CarneNick Gayatri Teasdale, PharmD Pharmacy Resident Pager: [phone] 06/18/2015,7:43 AM

## 2015-06-18 NOTE — Progress Notes (Signed)
*  PRELIMINARY RESULTS* Vascular Ultrasound Lower extremity venous duplex has been completed.  Preliminary findings: No evidence of DVT or baker's cyst.   Farrel DemarkJill Eunice, RDMS, RVT  06/18/2015, 10:28 AM

## 2015-06-18 NOTE — Progress Notes (Signed)
  Echocardiogram 2D Echocardiogram has been performed.  Dorothey BasemanReel, Vanice Rappa M 06/18/2015, 8:38 AM

## 2015-06-19 LAB — PROTEIN C, TOTAL: Protein C, Total: 79 % (ref 60–150)

## 2015-06-19 MED ORDER — TRAMADOL HCL 50 MG PO TABS: 50.0000 mg | ORAL_TABLET | Freq: Four times a day (QID) | ORAL | Status: DC | PRN

## 2015-06-19 MED ORDER — RIVAROXABAN (XARELTO) VTE STARTER PACK (15 & 20 MG): ORAL_TABLET | ORAL | Status: DC

## 2015-06-19 MED ORDER — RIVAROXABAN 20 MG PO TABS: 20.0000 mg | ORAL_TABLET | Freq: Every day | ORAL | Status: DC

## 2015-06-19 NOTE — Discharge Summary (Signed)
Physician Discharge Summary   Patient ID: Shane Daniel MRN: [id number] DOB/AGE: 1965/04/20 50 y.o.  Admit date: 06/17/2015 Discharge date: 06/19/2015  Primary Care Physician:  No primary care provider on file.  Discharge Diagnoses:     . Left pulmonary embolus ([place]) . Tobacco abuse . COPD (chronic obstructive pulmonary disease) ([facility])  Consults:  None   Recommendations for Outpatient Follow-up:  1. Please repeat CBC/BMET at next visit 2. Patient is in between jobs and will find out about his benefits on Wednesday, 5/31. He is given a starter kit and 30 day free card for xarelto (per his preference for NOAC) and will need preauthorization for further xarelto dosing 3. Please follow hypercoagulable workup   DIET: Heart healthy diet    Allergies:  No Known Allergies   DISCHARGE MEDICATIONS: Current Discharge Medication List    START taking these medications   Details  rivaroxaban (XARELTO) 20 MG TABS tablet Take 1 tablet (20 mg total) by mouth daily with supper. Qty: 30 tablet, Refills: 6    Rivaroxaban 15 & 20 MG TBPK Take as directed on package: Start with one 10m tablet by mouth twice a day with food. On Day 22 (07/09/15), switch to one 251mtablet once a day with food/supper. Qty: 51 each, Refills: 0    traMADol (ULTRAM) 50 MG tablet Take 1 tablet (50 mg total) by mouth every 6 (six) hours as needed for moderate pain or severe pain. Qty: 30 tablet, Refills: 0      CONTINUE these medications which have NOT CHANGED   Details  Naproxen Sodium (ALEVE) 220 MG CAPS Take 220 mg by mouth 4 (four) times daily as needed (pain).         Brief H and P: For complete details please refer to admission H and P, but in brief Patient is a 89ear old male with tobacco abuse, episodic alcohol use, presented with abrupt onset of sharp left shoulder pain radiating to the flank and chest, pleuritic. He also had some mild hemoptysis. CT of the chest showed submassive PE in the  distal left main pulmonary artery and left lower lobe pulmonary arteries with right heart strain. Patient was admitted for further workup.  Hospital Course:   Left pulmonary embolus , Submassive -Unclear etiology, no family history or long distance travels or period of immobility - Follow hypercoagulable panel. PSA within normal range 1.15. Patient also recommended screening colonoscopy, has not had a colonoscopy yet - Patient was started on therapeutic Lovenox. Discussed in detail with the patient regarding Lovenox/Coumadin versus NOAC. Patient prefers NOAC's.  Patient states he does not have insurance coverage at this time and is in between jobs, he will know his benefits on Wednesday, May 31. Case management consulted to give patient free 30 day card for xarelto and the xarelto starter kit. Subsequently he will need to have follow-up in community wellness center versus PCP, will need preauthorization for further xarelto. All the above was clearly explained to the patient in detail who wanted to be discharged on 5/28. He is hemodynamically stable with no hypoxia. He was started on xarelto on 5/27. - Doppler [place] the lower extremities did not show DVT - 2-D echo showed EF of 50-55% with grade 1 diastolic dysfunction, right ventricular systolic function normal    Tobacco abuse/COPD (chronic obstructive pulmonary disease)  -Counseled regarding cessation -No pulmonary masses noted on CT of the chest -Nicotine patch provided to patient   Regular alcohol use -Patient reports that he can drink  up to 2 beers per day and more on the weekends including liquor -Last drink was 3 days ago and currently is not experiencing any withdrawal symptoms  Day of Discharge BP 125/69 mmHg  Pulse 83  Temp(Src) 99.5 F (37.5 C) (Oral)  Resp 18  Ht 5' 8"  (1.727 m)  Wt 58.015 kg (127 lb 14.4 oz)  BMI 19.45 kg/m2  SpO2 100%  Physical Exam: General: Alert and awake oriented x3 not in any acute  distress. HEENT: anicteric sclera, pupils reactive to light and accommodation CVS: S1-S2 clear no murmur rubs or gallops Chest: clear to auscultation bilaterally, no wheezing rales or rhonchi Abdomen: soft nontender, nondistended, normal bowel sounds Extremities: no cyanosis, clubbing or edema noted bilaterally Neuro: Cranial nerves II-XII intact, no focal neurological deficits   The results of significant diagnostics from this hospitalization (including imaging, microbiology, ancillary and laboratory) are listed below for reference.    LAB RESULTS: Basic Metabolic Panel:  Recent Labs Lab 06/17/15 1357 06/17/15 2133 06/18/15 0427  NA 140  --  135  K 4.4  --  4.1  CL 103  --  103  CO2  --   --  25  GLUCOSE 107*  --  97  BUN 5*  --  8  CREATININE 0.70  --  0.77  CALCIUM  --   --  8.8*  MG  --  2.2  --   PHOS  --  2.8  --    Liver Function Tests:  Recent Labs Lab 06/18/15 0427  AST 45*  ALT 40  ALKPHOS 71  BILITOT 1.2  PROT 6.4*  ALBUMIN 2.6*   No results for input(s): LIPASE, AMYLASE in the last 168 hours. No results for input(s): AMMONIA in the last 168 hours. CBC:  Recent Labs Lab 06/17/15 1346  06/17/15 1644 06/18/15 0427  WBC 9.4  --   --  9.5  NEUTROABS 7.1  --   --   --   HGB 14.0  < > 15.1 13.1  HCT 41.5  < > 43.5 39.0  MCV 98.3  --   --  98.7  PLT 395  --  433* 460*  < > = values in this interval not displayed. Cardiac Enzymes:  Recent Labs Lab 06/17/15 2133 06/18/15 0427  TROPONINI <0.03 <0.03   BNP: Invalid input(s): POCBNP CBG: No results for input(s): GLUCAP in the last 168 hours.  Significant Diagnostic Studies:  Dg Chest 2 View  06/17/2015  CLINICAL DATA:  LEFT posterior lower chest pain for a week, today coughed up some blood, smoker, initial encounter EXAM: CHEST  2 VIEW COMPARISON:  None available FINDINGS: Normal heart size, mediastinal contours, and pulmonary vascularity. Retrocardiac LEFT lower lobe opacity posteriorly.  Remaining lungs clear. No pleural effusion or pneumothorax. Bones unremarkable. IMPRESSION: Posterior LEFT lower lobe infiltrate question pneumonia though alveolar hemorrhage can have a similar appearance. Followup radiographs recommended in 2-3 weeks to ensure resolution and exclude underlying mass/tumor. Electronically Signed   By: Lavonia Dana M.D.   On: 06/17/2015 13:23   Ct Angio Chest Pe W/cm &/or Wo Cm  06/17/2015  CLINICAL DATA:  Left chest pain for the past 5 days. Hemoptysis for the past 2 days. EXAM: CT ANGIOGRAPHY CHEST WITH CONTRAST TECHNIQUE: Multidetector CT imaging of the chest was performed using the standard protocol during bolus administration of intravenous contrast. Multiplanar CT image reconstructions and MIPs were obtained to evaluate the vascular anatomy. CONTRAST:  80 cc Isovue 370 COMPARISON:  Chest radiographs obtained  earlier today. FINDINGS: Mediastinum/Lymph Nodes: Pulmonary arterial filling defects in the distal left main pulmonary artery and at multiple left lower lobe pulmonary arteries. These are causing long segment complete occlusion of several left lower lobe pulmonary arteries. There are also more linear appearing filling defects in the distal right main pulmonary artery and adjacent branches. The right ventricular to left ventricular ratio is 0.97. No enlarged lymph nodes. Lungs/Pleura: Small left pleural effusion. Patchy opacity in the inferior left lower lobe. Mild diffuse bilateral centrilobular and paraseptal emphysema. Upper abdomen: No acute findings. Musculoskeletal: Minimal thoracic spine degenerative changes. Review of the MIP images confirms the above findings. IMPRESSION: 1. Acute pulmonary emboli in the distal left main pulmonary artery and left lower lobe pulmonary arteries including extensive left lower lobe pulmonary emboli occluding long segments of multiple left lower lobe branches with CT evidence of right heart strain (RV/LV Ratio = 0.97) consistent with at  least submassive (intermediate risk) PE. The presence of right heart strain has been associated with an increased risk of morbidity and mortality. Please activate Code PE by paging [phone]. 2. Left lower lobe pulmonary infarction. 3. Small left pleural effusion. 4. Linear filling defects in the distal right main pulmonary artery and adjacent branches, with an appearance compatible with subacute or chronic pulmonary emboli. 5. Mild centrilobular and paraseptal emphysema. Critical Value/emergent results were called by telephone at the time of interpretation on 06/17/2015 at 4:01 pm to Dr. Merrily Pew ON, who verbally acknowledged these results. Electronically Signed   By: Claudie Revering M.D.   On: 06/17/2015 16:04    2D ECHO: ------------------------------------------------------------------- Study Conclusions  - Left ventricle: ABnormal septal motion The cavity size was  normal. Wall thickness was normal. Systolic function was normal.  The estimated ejection fraction was in the range of 50% to 55%.  Doppler parameters are consistent with abnormal left ventricular  relaxation (grade 1 diastolic dysfunction). - Atrial septum: No defect or patent foramen ovale was identified.  Disposition and Follow-up: Discharge Instructions    Diet - low sodium heart healthy    Complete by:  As directed      Discharge instructions    Complete by:  As directed   Take XARELTO as directed on package: Start with one 64m tablet by mouth twice a day with food (BREAKFAST AND SUPPER).   On Day 22 (07/09/15), switch to one 229mtablet once a day with food/supper.  Please watch out for any bleeding. Do not take double dose if you miss a pill.Please call community wellness center on Monday 5/29 to make hospital follow-up appointment. Community wellness center will help with the subsequent xarelto doses until your benefits are established.     Increase activity slowly    Complete by:  As directed              DISPOSITION: home    DISCHARGE FOLLOW-UP Follow-up Information    [facility]   Why:  for hospital follow-up. You can also walk in for hospital follow-up.    Contact information:   [phone]       Time spent on Discharge: 2560m   Signed:   RAI,RIPUDEEP M.D. [facility] 06/19/2015, 10:09 AM Pager: [phone]

## 2015-06-19 NOTE — Progress Notes (Signed)
  Xeroflo kit given to patient instructions given . Patient verbalized understanding

## 2015-06-20 LAB — BETA-2-GLYCOPROTEIN I ABS, IGG/M/A: Beta-2-Glycoprotein I IgA: <9 GPI IgA units (ref 0–25)

## 2015-06-21 LAB — HOMOCYSTEINE: Homocysteine: 11 umol/L (ref 0.0–15.0)

## 2015-06-21 LAB — CARDIOLIPIN ANTIBODIES, IGG, IGM, IGA
ANTICARDIOLIPIN IGM: 11 [MPL'U]/mL (ref 0–12)
Anticardiolipin IgG: <9 GPL U/mL (ref 0–14)

## 2015-06-21 LAB — PROTEIN S ACTIVITY: Protein S Activity: 117 % (ref 63–140)

## 2015-06-21 LAB — PROTEIN C ACTIVITY: Protein C Activity: 109 % (ref 73–180)

## 2015-06-21 LAB — PROTEIN S, TOTAL: Protein S Ag, Total: 118 % (ref 60–150)

## 2015-06-22 LAB — LUPUS ANTICOAGULANT PANEL
DRVVT: 75.8 s — AB (ref 0.0–47.0)
PTT LA: 55 s — AB (ref 0.0–43.6)

## 2015-06-22 LAB — PTT-LA MIX: PTT-LA Mix: 52.4 s — ABNORMAL HIGH (ref 0.0–40.6)

## 2015-06-22 LAB — HEXAGONAL PHASE PHOSPHOLIPID: Hexagonal Phase Phospholipid: 15 s — ABNORMAL HIGH (ref 0–11)

## 2015-06-22 LAB — DRVVT MIX: DRVVT MIX: 49.6 s — AB (ref 0.0–47.0)

## 2015-06-22 LAB — DRVVT CONFIRM: DRVVT CONFIRM: 1.4 ratio — AB (ref 0.8–1.2)

## 2015-06-23 LAB — PROTHROMBIN GENE MUTATION

## 2015-06-24 ENCOUNTER — Ambulatory Visit: Payer: Self-pay | Admitting: Pharmacist

## 2015-06-24 ENCOUNTER — Encounter: Payer: Self-pay | Admitting: Internal Medicine

## 2015-06-24 ENCOUNTER — Ambulatory Visit (INDEPENDENT_AMBULATORY_CARE_PROVIDER_SITE_OTHER): Payer: Self-pay | Admitting: Internal Medicine

## 2015-06-24 VITALS — BP 131/85 | HR 84 | Temp 98.0°F | Ht 68.0 in | Wt 137.2 lb

## 2015-06-24 DIAGNOSIS — Z79899 Other long term (current) drug therapy: Secondary | ICD-10-CM

## 2015-06-24 DIAGNOSIS — Z719 Counseling, unspecified: Secondary | ICD-10-CM

## 2015-06-24 DIAGNOSIS — F17201 Nicotine dependence, unspecified, in remission: Secondary | ICD-10-CM

## 2015-06-24 DIAGNOSIS — Z720 Tobacco use: Secondary | ICD-10-CM

## 2015-06-24 DIAGNOSIS — I2699 Other pulmonary embolism without acute cor pulmonale: Secondary | ICD-10-CM

## 2015-06-24 DIAGNOSIS — Z7901 Long term (current) use of anticoagulants: Secondary | ICD-10-CM

## 2015-06-24 NOTE — Assessment & Plan Note (Signed)
Has not started back smoking since hospitalization. Has cravings occasionally but that resolves within few mins. He is determined not to start smoking again. I congratulated him and encouraged him to let us know if we can help him continue to avoid smoking.

## 2015-06-24 NOTE — Patient Instructions (Signed)
Please make sure you take your medications as instructed.  If you are having any problem with your medications please call us immediately. Don't skip any dosing. Pulmonary Embolism A pulmonary embolism (PE) is a sudden blockage or decrease of blood flow in one lung or both lungs. Most blockages come from a blood clot that travels from the legs or the pelvis to the lungs. PE is a dangerous and potentially life-threatening condition if it is not treated right away. CAUSES A pulmonary embolism occurs most commonly when a blood clot travels from one of your veins to your lungs. Rarely, PE is caused by air, fat, amniotic fluid, or part of a tumor traveling through your veins to your lungs. RISK FACTORS A PE is more likely to develop in:  People who smoke.  People who areolder, especially over 12 years of age.  People who are overweight (obese).  People who sit or lie still for a long time, such as during long-distance travel (over 4 hours), bed rest, hospitalization, or during recovery from certain medical conditions like a stroke.  People who do not engage in much physical activity (sedentary lifestyle).  People who have chronic breathing disorders.  People whohave a personal or family history of blood clots or blood clotting disease.  People whohave peripheral vascular disease (PVD), diabetes, or some types of cancer.  People who haveheart disease, especially if the person had a recent heart attack or has congestive heart failure.  People who have neurological diseases that affect the legs (leg paresis).  People who have had a traumatic injury, such as breaking a hip or leg.  People whohave recently had major or lengthy surgery, especially on the hip, knee, or abdomen.  People who have hada central line placed inside a large vein.  People who takemedicines that contain the hormone estrogen. These include birth control pills and hormone replacement therapy.  Pregnancy or  during childbirth or the postpartum period. SIGNS AND SYMPTOMS  The symptoms of a PE usually start suddenly and include:  Shortness of breath while active or at rest.  Coughing or coughing up blood or blood-tinged mucus.  Chest pain that is often worse with deep breaths.  Rapid or irregular heartbeat.  Feeling light-headed or dizzy.  Fainting.  Feelinganxious.  Sweating. There may also be pain and swelling in a leg if that is where the blood clot started. These symptoms may represent a serious problem that is an emergency. Do not wait to see if the symptoms will go away. Get medical help right away. Call your local emergency services (911 in the [place] Do not drive yourself to the hospital. DIAGNOSIS Your health care provider will take a medical history and perform a physical exam. You may also have other tests, including:  Blood tests to assess the clotting properties of your blood, assess oxygen levels in your blood, and find blood clots.  Imaging tests, such as CT, ultrasound, MRI, X-ray, and other tests to see if you have clots anywhere in your body.  An electrocardiogram (ECG) to look for heart strain from blood clots in the lungs. TREATMENT The main goals of PE treatment are:  To stop a blood clot from growing larger.  To stop new blood clots from forming. The type of treatment that you receive depends on many factors, such as the cause of your PE, your risk for bleeding or developing more clots, and other medical conditions that you have. Sometimes, a combination of treatments is necessary. This condition may  be treated with:  Medicines, including newer oral blood thinners (anticoagulants), warfarin, low molecular weight heparins, thrombolytics, or heparins.  Wearing compression stockings or using different types of devices.  Surgery (rare) to remove the blood clot or to place a filter in your abdomen to stop the blood clot from traveling to your lungs. Treatments  for a PE are often divided into immediate treatment, long-term treatment (up to 3 months after PE), and extended treatment (more than 3 months after PE). Your treatment may continue for several months. This is called maintenance therapy, and it is used to prevent the forming of new blood clots. You can work with your health care provider to choose the treatment program that is best for you. What are anticoagulants? Anticoagulants are medicines that treat PEs. They can stop current blood clots from growing and stop new clots from forming. They cannot dissolve existing clots. Your body dissolves clots by itself over time. Anticoagulants are given by mouth, by injection, or through an IV tube. What are thrombolytics? Thrombolytics are clot-dissolving medicines that are used to dissolve a PE. They carry a high risk of bleeding, so they tend to be used only in severe cases or if you have very low blood pressure. HOME CARE INSTRUCTIONS If you are taking a newer oral anticoagulant:  Take the medicine every single day at the same time each day.  Understand what foods and drugs interact with this medicine.  Understand that there are no regular blood tests required when using this medicine.  Understandthe side effects of this medicine, including excessive bruising or bleeding. Ask your health care provider or pharmacist about other possible side effects. If you are taking warfarin:  Understand how to take warfarin and know which foods can affect how warfarin works in Public relations account executive.  Understand that it is dangerous to taketoo much or too little warfarin. Too much warfarin increases the risk of bleeding. Too little warfarin continues to allow the risk for blood clots.  Follow your PT and INR blood testing schedule. The PT and INR results allow your health care provider to adjust your dose of warfarin. It is very important that you have your PT and INR tested as often as told by your health care  provider.  Avoid major changes in your diet, or tell your health care provider before you change your diet. Arrange a visit with a registered dietitian to answer your questions. Many foods, especially foods that are high in vitamin K, can interfere with warfarin and affect the PT and INR results. Eat a consistent amount of foods that are high in vitamin K, such as:  Spinach, kale, broccoli, cabbage, collard greens, turnip greens, Brussels sprouts, peas, cauliflower, seaweed, and parsley.  Beef liver and pork liver.  Green tea.  Soybean oil.  Tell your health care provider about any and all medicines, vitamins, and supplements that you take, including aspirin and other over-the-counter anti-inflammatory medicines. Be especially cautious with aspirin and anti-inflammatory medicines. Do not take those before you ask your health care provider if it is safe to do so. This is important because many medicines can interfere with warfarin and affect the PT and INR results.  Do not start or stop taking any over-the-counter or prescription medicine unless your health care provider or pharmacist tells you to do so. If you take warfarin, you will also need to do these things:  Hold pressure over cuts for longer than usual.  Tell your dentist and other health care providers that  you are taking warfarin before you have any procedures in which bleeding may occur.  Avoid alcohol or drink very small amounts. Tell your health care provider if you change your alcohol intake.  Do not use tobacco products, including cigarettes, chewing tobacco, and e-cigarettes. If you need help quitting, ask your health care provider.  Avoid contact sports. General Instructions  Take over-the-counter and prescription medicines only as told by your health care provider. Anticoagulant medicines can have side effects, including easy bruising and difficulty stopping bleeding. If you are prescribed an anticoagulant, you will also  need to do these things:  Hold pressure over cuts for longer than usual.  Tell your dentist and other health care providers that you are taking anticoagulants before you have any procedures in which bleeding may occur.  Avoid contact sports.  Wear a medical alert bracelet or carry a medical alert card that says you have had a PE.  Ask your health care provider how soon you can go back to your normal activities. Stay active to prevent new blood clots from forming.  Make sure to exercise while traveling or when you have been sitting or standing for a long period of time. It is very important to exercise. Exercise your legs by walking or by tightening and relaxing your leg muscles often. Take frequent walks.  Wear compression stockings as told by your health care provider to help prevent more blood clots from forming.  Do not use tobacco products, including cigarettes, chewing tobacco, and e-cigarettes. If you need help quitting, ask your health care provider.  Keep all follow-up appointments with your health care provider. This is important. PREVENTION Take these actions to decrease your risk of developing another PE:  Exercise regularly. For at least 30 minutes every day, engage in:  Activity that involves moving your arms and legs.  Activity that encourages good blood flow through your body by increasing your heart rate.  Exercise your arms and legs every hour during long-distance travel (over 4 hours). Drink plenty of water and avoid drinking alcohol while traveling.  Avoid sitting or lying in bed for long periods of time without moving your legs.  Maintain a weight that is appropriate for your height. Ask your health care provider what weight is healthy for you.  If you are a woman who is over 835 years of age, avoid unnecessary use of medicines that contain estrogen. These include birth control pills.  Do not smoke, especially if you take estrogen medicines. If you need help  quitting, ask your health care provider.  If you are at very high risk for PE, wear compression stockings.  If you recently had a PE, have regularly scheduled ultrasound testing on your legs to check for new blood clots. If you are hospitalized, prevention measures may include:  Early walking after surgery, as soon as your health care provider says that it is safe.  Receiving anticoagulants to prevent blood clots. If you cannot take anticoagulants, other options may be available, such as wearing compression stockings or using different types of devices. SEEK IMMEDIATE MEDICAL CARE IF:  You have new or increased pain, swelling, or redness in an arm or leg.  You have numbness or tingling in an arm or leg.  You have shortness of breath while active or at rest.  You have chest pain.  You have a rapid or irregular heartbeat.  You feel light-headed or dizzy.  You cough up blood.  You notice blood in your vomit, bowel movement,  or urine.  You have a fever. These symptoms may represent a serious problem that is an emergency. Do not wait to see if the symptoms will go away. Get medical help right away. Call your local emergency services (911 in the [place] Do not drive yourself to the hospital.   This information is not intended to replace advice given to you by your health care provider. Make sure you discuss any questions you have with your health care provider.   Document Released: 01/06/2000 Document Revised: 09/29/2014 Document Reviewed: 05/05/2014 Elsevier Interactive Patient Education [employer]

## 2015-06-24 NOTE — Progress Notes (Signed)
Medication Samples have been provided to the patient.  Drug name: Xarelto (rivaroxaban)       Strength: 15 mg        Qty: 42  LOT: [id number]  Exp.Date: 6/17  Dosing instructions: Take 1 tablet by mouth twice daily with food  The patient has been instructed regarding the correct time, dose, and frequency of taking this medication, including desired effects and most common side effects.   Kim,Jennifer J 11:03 AM 06/24/2015

## 2015-06-24 NOTE — Progress Notes (Signed)
Pharmacist physician co-visit 

## 2015-06-24 NOTE — Progress Notes (Signed)
   Subjective:    Patient ID: Shane Daniel, male    DOB: [birth year], 50 y.o.   MRN: [id number]  HPI  50 yo male with hx of COPD, tobacco abuse, recent hospitalization on 5/26-5/28 for acute unprovoked submassive left sided PE presents for hospital follow up.  No travel hx or family hx of clots. Was deemed to be unprovoked. ED did get hypercoag panel before starting lovenox. Was given a starter kit and 30 day free card for Xarelto.   hypercoag panel showed presence of lupus anticoagulant. 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   He has not been taking xarelto properly. He was given script for the starter pack ( to take 15 mg twice a day for 21 days). He went to the pharmacy but was told it would be $700 dollar. So he picked up his '20mg'$  daily supply for 30 days with his 30 day coupon. He took '20mg'$  once daily for May 29th through May 31st, skipped June 1st. He is still applying for jobs, expecting to find something soon in next month or so. Does not have insurance currently.   Denies any symptoms currenlty. States he used to ride [employer] for his previous job for several hours before getting off. That was at least 3 months ago. No recent long trips or surgeries.  No chest pain or SOB currently. Doing fine. No bleeding.    Review of Systems  Constitutional: Negative for fever and chills.  HENT: Negative for congestion and sore throat.   Respiratory: Negative for chest tightness and shortness of breath.   Cardiovascular: Negative for chest pain, palpitations and leg swelling.  Gastrointestinal: Negative for vomiting, abdominal pain, diarrhea, constipation, blood in stool, abdominal distention and rectal pain.  Genitourinary: Negative for dysuria and hematuria.  Musculoskeletal: Negative for back pain and arthralgias.  Neurological: Negative for dizziness, light-headedness and headaches.  Hematological: Negative for adenopathy. Does not bruise/bleed easily.    Psychiatric/Behavioral: Negative for behavioral problems and agitation.       Objective:   Physical Exam  Constitutional: He is oriented to person, place, and time. He appears well-developed and well-nourished.  HENT:  Head: Normocephalic and atraumatic.  Eyes: EOM are normal. Pupils are equal, round, and reactive to light.  Cardiovascular: Normal rate and regular rhythm.  Exam reveals no gallop and no friction rub.   No murmur heard. Pulmonary/Chest: Effort normal and breath sounds normal. No respiratory distress. He has no wheezes.  Abdominal: Soft. Bowel sounds are normal. He exhibits no distension. There is no tenderness.  Musculoskeletal: Normal range of motion. He exhibits no edema or tenderness.  Neurological: He is alert and oriented to person, place, and time.   Filed Vitals:   06/24/15 0936  BP: 131/85  Pulse: 84  Temp: 98 F (36.7 C)          Assessment & Plan:  See problem based a&p.

## 2015-06-24 NOTE — Assessment & Plan Note (Addendum)
hypercoag panel showed presence of lupus anticoagulant. 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 Had trouble picking up the right medication.  Asked our clinic pharmacist Dr. Selena BattenKim to help us. She provided free sample of 15mg  tablets to take twice a day for 21 days. Then he will resume taking 20mg  daily. Dr. Selena BattenKim is also applying for the med assist so that he can continue to afford his medication in the future after he finishes the free 30 days supply of 20mg  tablets.   - f/up in 3 months. Asked to call us if having trouble with medication in the mean time. Informed him with signs of DVT and PE. Asked not to skip any doses.

## 2015-06-25 LAB — FACTOR 5 LEIDEN

## 2015-06-27 NOTE — Progress Notes (Signed)
Internal Medicine Clinic Attending  Case discussed with Dr. Ahmed at the time of the visit.  We reviewed the resident's history and exam and pertinent patient test results.  I agree with the assessment, diagnosis, and plan of care documented in the resident's note. 

## 2015-06-30 ENCOUNTER — Ambulatory Visit: Payer: Self-pay

## 2015-07-01 ENCOUNTER — Ambulatory Visit: Payer: Self-pay

## 2015-07-06 ENCOUNTER — Ambulatory Visit: Payer: Self-pay

## 2015-07-22 ENCOUNTER — Telehealth: Payer: Self-pay

## 2015-07-28 NOTE — Telephone Encounter (Addendum)
Shane ForthSteven L Daniel is a 50 y.o. male who was contacted via telephone for monitoring of rivaroxaban (Xarelto) therapy.    ASSESSMENT Indication(s): PE Duration: 3 months  Labs:    Component Value Date/Time   AST 45* 06/18/2015 0427   ALT 40 06/18/2015 0427   NA 135 06/18/2015 0427   K 4.1 06/18/2015 0427   CL 103 06/18/2015 0427   CO2 25 06/18/2015 0427   GLUCOSE 97 06/18/2015 0427   BUN 8 06/18/2015 0427   CREATININE 0.77 06/18/2015 0427   CALCIUM 8.8* 06/18/2015 0427   GFRNONAA >60 06/18/2015 0427   GFRAA >60 06/18/2015 0427   WBC 9.5 06/18/2015 0427   HGB 13.1 06/18/2015 0427   HCT 39.0 06/18/2015 0427   PLT 460* 06/18/2015 0427    rivaroxaban (Xarelto) Dose: 20mg  po qd  Safety: Patient reports no recent signs or symptoms of bleeding, no signs of symptoms of thromboembolism. Medication changes: no.  Renal/hepatic/drug interaction concerns: no.  Adherence: Patient reports no known adherence challenges.  Patient does correctly recite the dose. Refill dates 06/19/2015 (30 day supply). Patient was also given medication samples in clinic on 06/24/15.  Patient Instructions: Patient advised to contact clinic or seek medical attention if signs/symptoms of bleeding or thromboembolism occur. Patient verbalized understanding by repeating back information.  Follow-up Next appointment 09/19/15  Shane Daniel PharmD Candidate  07/28/2015, 10:38 AM

## 2015-09-03 NOTE — Telephone Encounter (Signed)
Patient was contacted by Lenward Chancelloranya Makhlouf, PharmD candidate. I agree with the assessment and plan of care documented.  Patient was approved for medication access through Xarelto patient assistance program.

## 2015-09-19 ENCOUNTER — Encounter: Payer: Self-pay | Admitting: Internal Medicine

## 2016-02-13 ENCOUNTER — Encounter (HOSPITAL_COMMUNITY): Payer: Self-pay

## 2016-02-13 ENCOUNTER — Emergency Department (HOSPITAL_COMMUNITY): Payer: Self-pay

## 2016-02-13 ENCOUNTER — Emergency Department (HOSPITAL_COMMUNITY)
Admission: EM | Admit: 2016-02-13 | Discharge: 2016-02-13 | Disposition: A | Discharge status: Home / Self Care | Payer: Self-pay | Attending: Emergency Medicine | Admitting: Emergency Medicine

## 2016-02-13 DIAGNOSIS — J449 Chronic obstructive pulmonary disease, unspecified: Secondary | ICD-10-CM | POA: Insufficient documentation

## 2016-02-13 DIAGNOSIS — Z87891 Personal history of nicotine dependence: Secondary | ICD-10-CM | POA: Insufficient documentation

## 2016-02-13 DIAGNOSIS — Z7901 Long term (current) use of anticoagulants: Secondary | ICD-10-CM | POA: Insufficient documentation

## 2016-02-13 DIAGNOSIS — R197 Diarrhea, unspecified: Secondary | ICD-10-CM | POA: Insufficient documentation

## 2016-02-13 DIAGNOSIS — E876 Hypokalemia: Secondary | ICD-10-CM | POA: Insufficient documentation

## 2016-02-13 LAB — CBC
HCT: 46.2 % (ref 39.0–52.0)
Hemoglobin: 16.3 g/dL (ref 13.0–17.0)
MCH: 32.6 pg (ref 26.0–34.0)
MCHC: 35.3 g/dL (ref 30.0–36.0)
MCV: 92.4 fL (ref 78.0–100.0)
PLATELETS: 408 10*3/uL — AB (ref 150–400)
RBC: 5 MIL/uL (ref 4.22–5.81)
RDW: 13.4 % (ref 11.5–15.5)
WBC: 12.2 10*3/uL — ABNORMAL HIGH (ref 4.0–10.5)

## 2016-02-13 LAB — COMPREHENSIVE METABOLIC PANEL
ALT: 16 U/L — AB (ref 17–63)
AST: 29 U/L (ref 15–41)
Albumin: 2.7 g/dL — ABNORMAL LOW (ref 3.5–5.0)
Alkaline Phosphatase: 109 U/L (ref 38–126)
Anion gap: 10 (ref 5–15)
BILIRUBIN TOTAL: 0.9 mg/dL (ref 0.3–1.2)
BUN: 5 mg/dL — AB (ref 6–20)
CO2: 28 mmol/L (ref 22–32)
CREATININE: 1.03 mg/dL (ref 0.61–1.24)
Calcium: 8.4 mg/dL — ABNORMAL LOW (ref 8.9–10.3)
Chloride: 97 mmol/L — ABNORMAL LOW (ref 101–111)
Glucose, Bld: 108 mg/dL — ABNORMAL HIGH (ref 65–99)
POTASSIUM: 2.6 mmol/L — AB (ref 3.5–5.1)
Sodium: 135 mmol/L (ref 135–145)
TOTAL PROTEIN: 7.1 g/dL (ref 6.5–8.1)

## 2016-02-13 LAB — LIPASE, BLOOD: Lipase: 12 U/L (ref 11–51)

## 2016-02-13 MED ORDER — DIPHENOXYLATE-ATROPINE 2.5-0.025 MG PO TABS: 1.0000 | ORAL_TABLET | Freq: Four times a day (QID) | ORAL | 0 refills | Status: DC | PRN

## 2016-02-13 MED ORDER — POTASSIUM CHLORIDE ER 10 MEQ PO TBCR: 10.0000 meq | EXTENDED_RELEASE_TABLET | Freq: Three times a day (TID) | ORAL | 0 refills | Status: DC

## 2016-02-13 MED ORDER — POTASSIUM CHLORIDE CRYS ER 20 MEQ PO TBCR
40.0000 meq | EXTENDED_RELEASE_TABLET | Freq: Once | ORAL | Status: AC
  Administered 2016-02-13: 40 meq via ORAL
  Filled 2016-02-13: qty 2

## 2016-02-13 NOTE — ED Provider Notes (Signed)
[facility]-EMERGENCY DEPT Provider Note   CSN: [id number] Arrival date & time: 02/13/16  1103  By signing my name below, I, Freida Busmaniana Omoyeni, attest that this documentation has been prepared under the direction and in the presence of Rolland PorterMark Humberto Addo, MD . Electronically Signed: Freida Busmaniana Omoyeni, Scribe. 02/13/2016. 4:05 PM.  History   Chief Complaint Chief Complaint  Patient presents with  . URI  . Diarrhea    The history is provided by the patient. No language interpreter was used.     HPI Comments:  Shane Daniel is a 51 y.o. male who presents to the Emergency Department complaining of a productive cough and chest congestion x ~1 week.  He reports associated generalized weakness, decreased appetite, and multiple episodes of diarrhea. No alleviating factors noted. Pt states he had a fever a few days ago but it since resolved and he has not had a fever since. No vomiting.   History reviewed. No pertinent past medical history.  Patient Active Problem List   Diagnosis Date Noted  . Left pulmonary embolus (HCC) 06/17/2015  . Tobacco abuse 06/17/2015  . COPD (chronic obstructive pulmonary disease) (HCC) 06/17/2015  . Health care maintenance 04/27/2011    History reviewed. No pertinent surgical history.     Home Medications    Prior to Admission medications   Medication Sig Start Date End Date Taking? Authorizing Provider  rivaroxaban (XARELTO) 20 MG TABS tablet Take 1 tablet (20 mg total) by mouth daily with supper. 07/09/15   Ripudeep Jenna LuoK Rai, MD  Rivaroxaban 15 & 20 MG TBPK Take as directed on package: Start with one 15mg  tablet by mouth twice a day with food. On Day 22 (07/09/15), switch to one 20mg  tablet once a day with food/supper. 06/19/15   Ripudeep Jenna LuoK Rai, MD    Family History History reviewed. No pertinent family history.  Social History Social History  Substance Use Topics  . Smoking status: Former Smoker    Packs/day: 1.00    Types: Cigarettes  . Smokeless tobacco: Never Used      Comment: Stopped  since d/c from hospital.  . Alcohol use 0.0 oz/week     Comment: 2 beers /day     Allergies   Patient has no known allergies.   Review of Systems Review of Systems  Constitutional: Positive for appetite change. Negative for chills, diaphoresis, fatigue and fever.  HENT: Positive for congestion. Negative for mouth sores, sore throat and trouble swallowing.   Eyes: Negative for visual disturbance.  Respiratory: Positive for cough. Negative for chest tightness, shortness of breath and wheezing.   Cardiovascular: Negative for chest pain.  Gastrointestinal: Positive for diarrhea. Negative for abdominal distention, abdominal pain, nausea and vomiting.  Endocrine: Negative for polydipsia, polyphagia and polyuria.  Genitourinary: Negative for dysuria, frequency and hematuria.  Musculoskeletal: Negative for gait problem.  Skin: Negative for color change, pallor and rash.  Neurological: Positive for weakness. Negative for dizziness, syncope, light-headedness and headaches.  Hematological: Does not bruise/bleed easily.  Psychiatric/Behavioral: Negative for behavioral problems and confusion.     Physical Exam Updated Vital Signs BP 120/85 (BP Location: Left Arm)   Pulse 110   Temp 99 F (37.2 C) (Oral)   Resp 16   SpO2 100%   Physical Exam  Constitutional: He is oriented to person, place, and time. He appears well-developed and well-nourished. No distress.  HENT:  Head: Normocephalic.  Eyes: Conjunctivae are normal. Pupils are equal, round, and reactive to light. No scleral icterus.  Neck: Normal  range of motion. Neck supple. No thyromegaly present.  Cardiovascular: Normal rate and regular rhythm.  Exam reveals no gallop and no friction rub.   No murmur heard. Pulmonary/Chest: Effort normal and breath sounds normal. No respiratory distress. He has no wheezes. He has no rales.  Abdominal: Soft. Bowel sounds are normal. He exhibits no distension. There is no  tenderness. There is no rebound.  Musculoskeletal: Normal range of motion.  Neurological: He is alert and oriented to person, place, and time.  Skin: Skin is warm and dry. No rash noted.  Psychiatric: He has a normal mood and affect. His behavior is normal.     ED Treatments / Results  DIAGNOSTIC STUDIES:  Oxygen Saturation is 100% on RA, normal by my interpretation.    COORDINATION OF CARE:  4:04 PM Discussed treatment plan with pt at bedside and pt agreed to plan.  Labs (all labs ordered are listed, but only abnormal results are displayed) Labs Reviewed  COMPREHENSIVE METABOLIC PANEL - Abnormal; Notable for the following:       Result Value   Potassium 2.6 (*)    Chloride 97 (*)    Glucose, Bld 108 (*)    BUN 5 (*)    Calcium 8.4 (*)    Albumin 2.7 (*)    ALT 16 (*)    All other components within normal limits  CBC - Abnormal; Notable for the following:    WBC 12.2 (*)    Platelets 408 (*)    All other components within normal limits  LIPASE, BLOOD    EKG  EKG Interpretation None       Radiology Dg Chest 2 View  Result Date: 02/13/2016 CLINICAL DATA:  Cough and fever for 1 week EXAM: CHEST  2 VIEW COMPARISON:  Chest radiograph and chest CT Jun 17, 2015 FINDINGS: Lungs are clear. Heart size and pulmonary vascularity are normal. No adenopathy. No bone lesions. IMPRESSION: Interval clearing of left lower lobe pneumonia. Currently lungs clear. Cardiac silhouette within normal limits. Electronically Signed   By: Bretta Bang III M.D.   On: 02/13/2016 13:24    Procedures Procedures (including critical care time)  Medications Ordered in ED Medications - No data to display   Initial Impression / Assessment and Plan / ED Course  I have reviewed the triage vital signs and the nursing notes.  Pertinent labs & imaging results that were available during my care of the patient were reviewed by me and considered in my medical decision making (see chart for  details).     Likely viral syndrome. Doubt pneumonia. Given Lomotil for his diarrhea. Potassium placed given the emergent. We'll continue 3 times a day for the next 7 days. Primary care follow-up. ER with any worsening symptoms.  Final Clinical Impressions(s) / ED Diagnoses   Final diagnoses:  None    New Prescriptions New Prescriptions   No medications on file   I personally performed the services described in this documentation, which was scribed in my presence. The recorded information has been reviewed and is accurate.     Rolland Porter, MD 02/13/16 [phone]

## 2016-02-13 NOTE — ED Triage Notes (Signed)
Pt reports cough, chest congestion, diarrhea and loss of appetite X2 weeks. P reports trying OTC meds with no relief.

## 2016-02-18 ENCOUNTER — Encounter (HOSPITAL_COMMUNITY): Payer: Self-pay | Admitting: Emergency Medicine

## 2016-02-18 ENCOUNTER — Emergency Department (HOSPITAL_COMMUNITY)
Admission: EM | Admit: 2016-02-18 | Discharge: 2016-02-18 | Disposition: A | Discharge status: Home / Self Care | Payer: Self-pay | Attending: Emergency Medicine | Admitting: Emergency Medicine

## 2016-02-18 DIAGNOSIS — J449 Chronic obstructive pulmonary disease, unspecified: Secondary | ICD-10-CM | POA: Insufficient documentation

## 2016-02-18 DIAGNOSIS — R197 Diarrhea, unspecified: Secondary | ICD-10-CM | POA: Insufficient documentation

## 2016-02-18 DIAGNOSIS — Z87891 Personal history of nicotine dependence: Secondary | ICD-10-CM | POA: Insufficient documentation

## 2016-02-18 DIAGNOSIS — Z7901 Long term (current) use of anticoagulants: Secondary | ICD-10-CM | POA: Insufficient documentation

## 2016-02-18 LAB — COMPREHENSIVE METABOLIC PANEL
ALT: 12 U/L — ABNORMAL LOW (ref 17–63)
ANION GAP: 12 (ref 5–15)
AST: 17 U/L (ref 15–41)
Albumin: 2.6 g/dL — ABNORMAL LOW (ref 3.5–5.0)
Alkaline Phosphatase: 75 U/L (ref 38–126)
BUN: <5 mg/dL — ABNORMAL LOW (ref 6–20)
CHLORIDE: 93 mmol/L — AB (ref 101–111)
CO2: 28 mmol/L (ref 22–32)
CREATININE: 0.75 mg/dL (ref 0.61–1.24)
Calcium: 8.6 mg/dL — ABNORMAL LOW (ref 8.9–10.3)
Glucose, Bld: 100 mg/dL — ABNORMAL HIGH (ref 65–99)
POTASSIUM: 2.8 mmol/L — AB (ref 3.5–5.1)
SODIUM: 133 mmol/L — AB (ref 135–145)
Total Bilirubin: 0.6 mg/dL (ref 0.3–1.2)
Total Protein: 7.2 g/dL (ref 6.5–8.1)

## 2016-02-18 LAB — LIPASE, BLOOD: LIPASE: 14 U/L (ref 11–51)

## 2016-02-18 LAB — URINALYSIS, ROUTINE W REFLEX MICROSCOPIC
Bilirubin Urine: NEGATIVE
GLUCOSE, UA: NEGATIVE mg/dL
Hgb urine dipstick: NEGATIVE
Ketones, ur: NEGATIVE mg/dL
LEUKOCYTES UA: NEGATIVE
Nitrite: NEGATIVE
PROTEIN: NEGATIVE mg/dL
SPECIFIC GRAVITY, URINE: 1.008 (ref 1.005–1.030)
pH: 7 (ref 5.0–8.0)

## 2016-02-18 LAB — CBC
HEMATOCRIT: 42.3 % (ref 39.0–52.0)
HEMOGLOBIN: 14.5 g/dL (ref 13.0–17.0)
MCH: 32 pg (ref 26.0–34.0)
MCHC: 34.3 g/dL (ref 30.0–36.0)
MCV: 93.4 fL (ref 78.0–100.0)
PLATELETS: 531 10*3/uL — AB (ref 150–400)
RBC: 4.53 MIL/uL (ref 4.22–5.81)
RDW: 12.9 % (ref 11.5–15.5)
WBC: 12.6 10*3/uL — AB (ref 4.0–10.5)

## 2016-02-18 MED ORDER — DICYCLOMINE HCL 20 MG PO TABS
20.0000 mg | ORAL_TABLET | Freq: Two times a day (BID) | ORAL | 0 refills | Status: DC
Start: 2016-02-18 — End: 2018-11-01

## 2016-02-18 MED ORDER — POTASSIUM CHLORIDE CRYS ER 20 MEQ PO TBCR
40.0000 meq | EXTENDED_RELEASE_TABLET | Freq: Once | ORAL | Status: AC
  Administered 2016-02-18: 40 meq via ORAL
  Filled 2016-02-18: qty 2

## 2016-02-18 MED ORDER — DICYCLOMINE HCL 10 MG PO CAPS
20.0000 mg | ORAL_CAPSULE | Freq: Once | ORAL | Status: AC
  Administered 2016-02-18: 20 mg via ORAL
  Filled 2016-02-18: qty 2

## 2016-02-18 NOTE — Discharge Instructions (Signed)
Your potassium was still low today.  We gave you supplements here but you need to continue taking them at home as well.   Take bentyl as needed for pain/spasm.  Can continue lomotil or imodium for diarrhea as wel. Follow-up with your primary care doctor. Return to the ED for new or worsening symptoms.

## 2016-02-18 NOTE — ED Provider Notes (Signed)
[facility]-EMERGENCY DEPT Provider Note   CSN: [id number] Arrival date & time: 02/18/16  1626     History   Chief Complaint Chief Complaint  Patient presents with  . Abdominal Pain    HPI Shane Daniel is a 51 y.o. male.  The history is provided by the patient and medical records.  Abdominal Pain   Associated symptoms include diarrhea.     51 y.o. M with hx of PE on xarelto, COPD, presenting to the ED for diarrhea.  Patient reports this has been ongoing for about a week now.  States he was seen here a few days ago and prescribed medication but has not had any relief of symptoms.  Patient reports he has some LLQ pain, but only just prior to needing to have a BM.  States after he uses the bathroom or passes gas pain will resolve.  States sharp, spasm-like in nature.  Denies vomiting. No fever or chills.  Has been following BRAT diet at home.  Estimates 4-5 loose stools daily.  No blood or melena.  Patient denies any current pain at present.  History reviewed. No pertinent past medical history.  Patient Active Problem List   Diagnosis Date Noted  . Left pulmonary embolus (HCC) 06/17/2015  . Tobacco abuse 06/17/2015  . COPD (chronic obstructive pulmonary disease) (HCC) 06/17/2015  . Health care maintenance 04/27/2011    History reviewed. No pertinent surgical history.     Home Medications    Prior to Admission medications   Medication Sig Start Date End Date Taking? Authorizing Provider  diphenoxylate-atropine (LOMOTIL) 2.5-0.025 MG tablet Take 1 tablet by mouth 4 (four) times daily as needed for diarrhea or loose stools. 02/13/16   Rolland Porter, MD  potassium chloride (K-DUR) 10 MEQ tablet Take 1 tablet (10 mEq total) by mouth 3 (three) times daily. 02/13/16   Rolland Porter, MD  rivaroxaban (XARELTO) 20 MG TABS tablet Take 1 tablet (20 mg total) by mouth daily with supper. 07/09/15   Ripudeep Jenna Luo, MD  Rivaroxaban 15 & 20 MG TBPK Take as directed on package: Start with one 15mg   tablet by mouth twice a day with food. On Day 22 (07/09/15), switch to one 20mg  tablet once a day with food/supper. 06/19/15   Ripudeep Jenna Luo, MD    Family History History reviewed. No pertinent family history.  Social History Social History  Substance Use Topics  . Smoking status: Former Smoker    Packs/day: 1.00    Types: Cigarettes  . Smokeless tobacco: Never Used     Comment: Stopped  since d/c from hospital.  . Alcohol use 0.0 oz/week     Comment: 2 beers /day     Allergies   Patient has no known allergies.   Review of Systems Review of Systems  Gastrointestinal: Positive for abdominal pain and diarrhea.  All other systems reviewed and are negative.    Physical Exam Updated Vital Signs BP 124/78 (BP Location: Left Arm)   Pulse 109   Temp 98.6 F (37 C) (Oral)   Resp 18   Ht 5\' 8"  (1.727 m)   Wt 65.8 kg   SpO2 98%   BMI 22.05 kg/m   Physical Exam  Constitutional: He is oriented to person, place, and time. He appears well-developed and well-nourished.  Appears well, no distress, watching television in room  HENT:  Head: Normocephalic and atraumatic.  Mouth/Throat: Oropharynx is clear and moist.  Eyes: Conjunctivae and EOM are normal. Pupils are equal, round, and  reactive to light.  Neck: Normal range of motion.  Cardiovascular: Normal rate, regular rhythm and normal heart sounds.   Pulmonary/Chest: Effort normal and breath sounds normal.  Abdominal: Soft. Bowel sounds are normal. There is no tenderness. There is no rigidity and no guarding.  Abdomen soft and nontender even with deep palpation, normal bowel sounds, no distention  Musculoskeletal: Normal range of motion.  Neurological: He is alert and oriented to person, place, and time.  Skin: Skin is warm and dry.  Psychiatric: He has a normal mood and affect.  Nursing note and vitals reviewed.    ED Treatments / Results  Labs (all labs ordered are listed, but only abnormal results are  displayed) Labs Reviewed  COMPREHENSIVE METABOLIC PANEL - Abnormal; Notable for the following:       Result Value   Sodium 133 (*)    Potassium 2.8 (*)    Chloride 93 (*)    Glucose, Bld 100 (*)    BUN <5 (*)    Calcium 8.6 (*)    Albumin 2.6 (*)    ALT 12 (*)    All other components within normal limits  CBC - Abnormal; Notable for the following:    WBC 12.6 (*)    Platelets 531 (*)    All other components within normal limits  LIPASE, BLOOD  URINALYSIS, ROUTINE W REFLEX MICROSCOPIC    EKG  EKG Interpretation None       Radiology No results found.  Procedures Procedures (including critical care time)  Medications Ordered in ED Medications  potassium chloride SA (K-DUR,KLOR-CON) CR tablet 40 mEq (40 mEq Oral Given 02/18/16 2156)  dicyclomine (BENTYL) capsule 20 mg (20 mg Oral Given 02/18/16 2156)     Initial Impression / Assessment and Plan / ED Course  I have reviewed the triage vital signs and the nursing notes.  Pertinent labs & imaging results that were available during my care of the patient were reviewed by me and considered in my medical decision making (see chart for details).  51 year old male here with diarrhea. He reports abdominal pain just prior to needing to have a bowel movement, but resolves once he is able to go. Denies any pain at present. He is afebrile and nontoxic. His abdomen is soft and completely benign. His lab work is overall reassuring-- continues to have a hypokalemia which may be due to his continued diarrhea. WBC count largely unchanged from prior.  He was given potassium supplements here. He was also given Bentyl. Has not had any recurrence of pain here. He has been eating and drinking without issue. Abdomen remains soft and benign.  Will plan to discharge home with continue symptomatic care. Have added Bentyl to home medications as pain seems spasmodic in nature.  Continue home potassium supplements.  Follow-up with PCP for re-check within  the next few days.  Discussed plan with patient, he/she acknowledged understanding and agreed with plan of care.  Return precautions given for new or worsening symptoms.  Final Clinical Impressions(s) / ED Diagnoses   Final diagnoses:  Diarrhea, unspecified type    New Prescriptions Discharge Medication List as of 02/18/2016 11:27 PM    START taking these medications   Details  dicyclomine (BENTYL) 20 MG tablet Take 1 tablet (20 mg total) by mouth 2 (two) times daily., Starting Sat 02/18/2016, Print         Garlon HatchetLisa M Martha Ellerby, PA-C 02/19/16 [id number]    Rolland PorterMark James, MD 03/02/16 [phone]

## 2016-02-18 NOTE — ED Triage Notes (Signed)
Patient c/o L sided abd pain x 1 week with diarrhea. Pt states he was here on Monday for same symptoms, given potassium supplement and pills for the diarrhea, which have not helped. Pt denies fevers, N/V but endorses generalized weakness.

## 2016-06-29 ENCOUNTER — Encounter: Payer: Self-pay | Admitting: *Deleted

## 2018-10-31 ENCOUNTER — Emergency Department (HOSPITAL_COMMUNITY): Payer: 59

## 2018-10-31 ENCOUNTER — Other Ambulatory Visit: Payer: Self-pay

## 2018-10-31 ENCOUNTER — Encounter (HOSPITAL_COMMUNITY): Payer: Self-pay | Admitting: Internal Medicine

## 2018-10-31 ENCOUNTER — Observation Stay (HOSPITAL_COMMUNITY)
Admission: EM | Admit: 2018-10-31 | Discharge: 2018-11-01 | Disposition: A | Discharge status: Home / Self Care | Payer: 59 | Attending: Internal Medicine | Admitting: Internal Medicine

## 2018-10-31 DIAGNOSIS — Z7901 Long term (current) use of anticoagulants: Secondary | ICD-10-CM | POA: Insufficient documentation

## 2018-10-31 DIAGNOSIS — Z79899 Other long term (current) drug therapy: Secondary | ICD-10-CM | POA: Diagnosis not present

## 2018-10-31 DIAGNOSIS — R000 Tachycardia, unspecified: Secondary | ICD-10-CM | POA: Insufficient documentation

## 2018-10-31 DIAGNOSIS — Z20828 Contact with and (suspected) exposure to other viral communicable diseases: Secondary | ICD-10-CM | POA: Diagnosis not present

## 2018-10-31 DIAGNOSIS — J449 Chronic obstructive pulmonary disease, unspecified: Secondary | ICD-10-CM | POA: Diagnosis not present

## 2018-10-31 DIAGNOSIS — K760 Fatty (change of) liver, not elsewhere classified: Secondary | ICD-10-CM | POA: Insufficient documentation

## 2018-10-31 DIAGNOSIS — Z86711 Personal history of pulmonary embolism: Secondary | ICD-10-CM | POA: Insufficient documentation

## 2018-10-31 DIAGNOSIS — Z87891 Personal history of nicotine dependence: Secondary | ICD-10-CM | POA: Diagnosis not present

## 2018-10-31 DIAGNOSIS — I700 Atherosclerosis of aorta: Secondary | ICD-10-CM | POA: Insufficient documentation

## 2018-10-31 DIAGNOSIS — I2699 Other pulmonary embolism without acute cor pulmonale: Principal | ICD-10-CM | POA: Insufficient documentation

## 2018-10-31 DIAGNOSIS — Z86718 Personal history of other venous thrombosis and embolism: Secondary | ICD-10-CM | POA: Diagnosis not present

## 2018-10-31 DIAGNOSIS — E876 Hypokalemia: Secondary | ICD-10-CM | POA: Insufficient documentation

## 2018-10-31 LAB — CBC
HCT: 46.8 % (ref 39.0–52.0)
Hemoglobin: 15.6 g/dL (ref 13.0–17.0)
MCH: 33.1 pg (ref 26.0–34.0)
MCHC: 33.3 g/dL (ref 30.0–36.0)
MCV: 99.4 fL (ref 80.0–100.0)
Platelets: UNDETERMINED 10*3/uL (ref 150–400)
RBC: 4.71 MIL/uL (ref 4.22–5.81)
RDW: 12.5 % (ref 11.5–15.5)
WBC: 9.3 10*3/uL (ref 4.0–10.5)
nRBC: 0 % (ref 0.0–0.2)

## 2018-10-31 LAB — BASIC METABOLIC PANEL
Anion gap: 9 (ref 5–15)
BUN: 7 mg/dL (ref 6–20)
CO2: 27 mmol/L (ref 22–32)
Calcium: 8.9 mg/dL (ref 8.9–10.3)
Chloride: 101 mmol/L (ref 98–111)
Creatinine, Ser: 0.9 mg/dL (ref 0.61–1.24)
GFR calc Af Amer: >60 mL/min (ref >60)
GFR calc non Af Amer: >60 mL/min (ref >60)
Glucose, Bld: 114 mg/dL — ABNORMAL HIGH (ref 70–99)
Potassium: 3.2 mmol/L — ABNORMAL LOW (ref 3.5–5.1)
Sodium: 137 mmol/L (ref 135–145)

## 2018-10-31 LAB — D-DIMER, QUANTITATIVE: D-Dimer, Quant: 1.8 ug/mL-FEU — ABNORMAL HIGH (ref 0.00–0.50)

## 2018-10-31 LAB — TROPONIN I (HIGH SENSITIVITY)
Troponin I (High Sensitivity): 5 ng/L (ref <18)
Troponin I (High Sensitivity): 6 ng/L (ref <18)

## 2018-10-31 MED ORDER — ACETAMINOPHEN 325 MG PO TABS: 650.0000 mg | ORAL_TABLET | Freq: Four times a day (QID) | ORAL | Status: DC | PRN

## 2018-10-31 MED ORDER — HEPARIN BOLUS VIA INFUSION
4500.0000 [IU] | Freq: Once | INTRAVENOUS | Status: AC
  Administered 2018-10-31: 4500 [IU] via INTRAVENOUS
  Filled 2018-10-31: qty 4500

## 2018-10-31 MED ORDER — ONDANSETRON HCL 4 MG PO TABS: 4.0000 mg | ORAL_TABLET | Freq: Four times a day (QID) | ORAL | Status: DC | PRN

## 2018-10-31 MED ORDER — HEPARIN (PORCINE) 25000 UT/250ML-% IV SOLN
1150.0000 [IU]/h | INTRAVENOUS | Status: AC
  Administered 2018-10-31: 1150 [IU]/h via INTRAVENOUS
  Filled 2018-10-31: qty 250

## 2018-10-31 MED ORDER — SODIUM CHLORIDE 0.9% FLUSH
3.0000 mL | Freq: Once | INTRAVENOUS | Status: AC
  Administered 2018-10-31: 20:00:00 3 mL via INTRAVENOUS

## 2018-10-31 MED ORDER — ACETAMINOPHEN 650 MG RE SUPP: 650.0000 mg | Freq: Four times a day (QID) | RECTAL | Status: DC | PRN

## 2018-10-31 MED ORDER — ONDANSETRON HCL 4 MG/2ML IJ SOLN: 4.0000 mg | Freq: Four times a day (QID) | INTRAMUSCULAR | Status: DC | PRN

## 2018-10-31 MED ORDER — IOHEXOL 350 MG/ML SOLN
100.0000 mL | Freq: Once | INTRAVENOUS | Status: AC | PRN
  Administered 2018-10-31: 100 mL via INTRAVENOUS

## 2018-10-31 NOTE — H&P (Signed)
History and Physical    Shane Daniel IZT:[id number] DOB: 18-Feb-1965 DOA: 10/31/2018  PCP: Patient, No Pcp Per  Patient coming from: Home.  Chief Complaint: Chest pain.  HPI: Shane Daniel is a 53 y.o. male with history of pulmonary embolism in 2017 at that time blood work showed lupus anticoagulant being positive patient was on anticoagulation for 3 months presents to the ER with complaints of having pleuritic type of chest pain over the last 3 days.  Patient states pain is across the chest with no associated shortness of breath productive cough fever or chills.  Denies noticing any lower extremity edema.  Has not had any recent travel.  Has family history of having DVT in his dad.  ED Course: In the ER EKG shows sinus tachycardia.  CT angiogram of the chest was done after d-dimer was positive.  Shows bilateral subsegmental and segmental pulmonary embolism with no strain pattern.  Shows possible pulmonary infarct in the lingula.  High-sensitivity troponins were 5 and 6.  Patient was started on heparin infusion and admitted for further management.  Lab work show COVID 90 was negative hemoglobin 15.6 creatinine 0.9.  Review of Systems: As per HPI, rest all negative.   History reviewed. No pertinent past medical history.  History reviewed. No pertinent surgical history.   reports that he has quit smoking. His smoking use included cigarettes. He smoked 1.00 pack per day. He has never used smokeless tobacco. He reports current alcohol use. He reports that he does not use drugs.  No Known Allergies  Family History - Father DVT.  Prior to Admission medications   Medication Sig Start Date End Date Taking? Authorizing Provider  RESTASIS 0.05 % ophthalmic emulsion Place 1 drop into both eyes 2 (two) times daily. 08/30/18  Yes [provider]  dicyclomine (BENTYL) 20 MG tablet Take 1 tablet (20 mg total) by mouth 2 (two) times daily. Patient not taking: Reported on 10/31/2018 02/18/16    Larene Pickett, PA-C  diphenoxylate-atropine (LOMOTIL) 2.5-0.025 MG tablet Take 1 tablet by mouth 4 (four) times daily as needed for diarrhea or loose stools. Patient not taking: Reported on 10/31/2018 02/13/16   Tanna Furry, MD  potassium chloride (K-DUR) 10 MEQ tablet Take 1 tablet (10 mEq total) by mouth 3 (three) times daily. Patient not taking: Reported on 10/31/2018 02/13/16   Tanna Furry, MD  rivaroxaban (XARELTO) 20 MG TABS tablet Take 1 tablet (20 mg total) by mouth daily with supper. Patient not taking: Reported on 10/31/2018 07/09/15   Mendel Corning, MD  Rivaroxaban 15 & 20 MG TBPK Take as directed on package: Start with one 15mg  tablet by mouth twice a day with food. On Day 22 (07/09/15), switch to one 20mg  tablet once a day with food/supper. Patient not taking: Reported on 02/18/2016 06/19/15   Mendel Corning, MD    Physical Exam: Constitutional: Moderately built and nourished. Vitals:   10/31/18 1957 10/31/18 2000 10/31/18 2100 10/31/18 2200  BP: (!) 133/103 (!) 142/102 127/90 139/83  Pulse: 96 95 91 91  Resp: 18 (!) 22 20 14   Temp:      TempSrc:      SpO2: 100% 100% 98% 99%  Weight:    71.2 kg  Height:    5\' 8"  (1.727 m)   Eyes: Anicteric no pallor. ENMT: No discharge from the ears eyes nose or mouth. Neck: No JVD appreciated no mass felt. Respiratory: No rhonchi or crepitations. Cardiovascular: S1-S2 heard. Abdomen: Soft nontender  bowel sounds present. Musculoskeletal: No edema. Skin: No rash. Neurologic: Alert awake oriented to time place and person.  Moves all extremities. Psychiatric: Appears normal per normal affect.   Labs on Admission: I have personally reviewed following labs and imaging studies  CBC: Recent Labs  Lab 10/31/18 1614  WBC 9.3  HGB 15.6  HCT 46.8  MCV 99.4  PLT PLATELET CLUMPS NOTED ON SMEAR, UNABLE TO ESTIMATE   Basic Metabolic Panel: Recent Labs  Lab 10/31/18 1614  NA 137  K 3.2*  CL 101  CO2 27  GLUCOSE 114*  BUN 7    CREATININE 0.90  CALCIUM 8.9   GFR: Estimated Creatinine Clearance: 89.7 mL/min (by C-G formula based on SCr of 0.9 mg/dL). Liver Function Tests: No results for input(s): AST, ALT, ALKPHOS, BILITOT, PROT, ALBUMIN in the last 168 hours. No results for input(s): LIPASE, AMYLASE in the last 168 hours. No results for input(s): AMMONIA in the last 168 hours. Coagulation Profile: No results for input(s): INR, PROTIME in the last 168 hours. Cardiac Enzymes: No results for input(s): CKTOTAL, CKMB, CKMBINDEX, TROPONINI in the last 168 hours. BNP (last 3 results) No results for input(s): PROBNP in the last 8760 hours. HbA1C: No results for input(s): HGBA1C in the last 72 hours. CBG: No results for input(s): GLUCAP in the last 168 hours. Lipid Profile: No results for input(s): CHOL, HDL, LDLCALC, TRIG, CHOLHDL, LDLDIRECT in the last 72 hours. Thyroid Function Tests: No results for input(s): TSH, T4TOTAL, FREET4, T3FREE, THYROIDAB in the last 72 hours. Anemia Panel: No results for input(s): VITAMINB12, FOLATE, FERRITIN, TIBC, IRON, RETICCTPCT in the last 72 hours. Urine analysis:    Component Value Date/Time   COLORURINE YELLOW 02/18/2016 2138   APPEARANCEUR CLEAR 02/18/2016 2138   LABSPEC 1.008 02/18/2016 2138   PHURINE 7.0 02/18/2016 2138   GLUCOSEU NEGATIVE 02/18/2016 2138   HGBUR NEGATIVE 02/18/2016 2138   BILIRUBINUR NEGATIVE 02/18/2016 2138   KETONESUR NEGATIVE 02/18/2016 2138   PROTEINUR NEGATIVE 02/18/2016 2138   NITRITE NEGATIVE 02/18/2016 2138   LEUKOCYTESUR NEGATIVE 02/18/2016 2138   Sepsis Labs: @LABRCNTIP (procalcitonin:4,lacticidven:4) )No results found for this or any previous visit (from the past 240 hour(s)).   Radiological Exams on Admission: Dg Chest 2 View  Result Date: 10/31/2018 CLINICAL DATA:  Chest pain with deep breathing. No medical history. Former smoker. EXAM: CHEST - 2 VIEW COMPARISON:  Chest radiograph 02/13/2016 FINDINGS: The heart size and  mediastinal contours are within normal limits. Minimal linear opacities at the left base likely reflect atelectasis or scarring. No focal pulmonary opacity concerning for infection. No evidence of edema. No pneumothorax or pleural effusion. The visualized skeletal structures are unremarkable. IMPRESSION: No evidence of active disease. Minimal left basilar atelectasis or scarring. Electronically Signed   By: Emmaline KluverNancy  Ballantyne M.D.   On: 10/31/2018 17:14   Ct Angio Chest Pe W And/or Wo Contrast  Result Date: 10/31/2018 CLINICAL DATA:  Chest pain. EXAM: CT ANGIOGRAPHY CHEST WITH CONTRAST TECHNIQUE: Multidetector CT imaging of the chest was performed using the standard protocol during bolus administration of intravenous contrast. Multiplanar CT image reconstructions and MIPs were obtained to evaluate the vascular anatomy. CONTRAST:  100mL OMNIPAQUE IOHEXOL 350 MG/ML SOLN COMPARISON:  Jun 17, 2015. FINDINGS: Cardiovascular: Contrast injection is sufficient to demonstrate satisfactory opacification of the pulmonary arteries to the segmental level.There are acute bilateral segmental and subsegmental pulmonary emboli. The main pulmonary artery is dilated measuring approximately 3 cm in diameter. There is no CT evidence of acute right heart strain.  The visualized aorta is normal. Heart size is mildly enlarged. There is no significant pericardial effusion. Mediastinum/Nodes: --No mediastinal or hilar lymphadenopathy. --No axillary lymphadenopathy. --No supraclavicular lymphadenopathy. --Normal thyroid gland. --The esophagus is unremarkable Lungs/Pleura: There is an airspace opacity in the lingula. There is consolidation in the right middle lobe. There is atelectasis at the lung bases. There is no pneumothorax. There is no significant pleural effusion. Upper Abdomen: There is hepatic steatosis. Musculoskeletal: No chest wall abnormality. No acute or significant osseous findings. Review of the MIP images confirms the above  findings. IMPRESSION: 1. Acute bilateral segmental and subsegmental pulmonary emboli. No CT evidence for acute right heart strain. 2. Consolidation in the lingula and right middle lobe, concerning for pulmonary infarcts versus pneumonia. 3. Hepatic steatosis. Aortic Atherosclerosis (ICD10-I70.0). These results were called by telephone at the time of interpretation on 10/31/2018 at 9:37 pm to provider ADAM CURATOLO , who verbally acknowledged these results. Electronically Signed   By: Katherine Mantle M.D.   On: 10/31/2018 21:39    EKG: Independently reviewed.  Tachycardia.  Assessment/Plan Principal Problem:   Pulmonary embolism (HCC)    1. Acute bilateral segmental and subsegmental pulmonary bolus him with no strain pattern noticed -patient is hemodynamically stable.  Pulmonary embolism was unprovoked.  Blood work done in 2017 showed lupus anticoagulant being positive.  Patient also has a family history of patient's dad having recurrent DVTs.  Patient has been placed on heparin at this time.  Will check Dopplers of the lower extremity.  Patient likely need to be on lifelong anticoagulation. 2. Tobacco abuse -advised about quitting. 3. Patient states he drinks alcohol only in the weekends.  Usually a 12 pack. 4. Platelet counts were not possible to be counted in the first CBC repeat CBC has been ordered. 5. Mild hypokalemia replace and recheck.   DVT prophylaxis: Heparin. Code Status: Full code. Family Communication: Discussed with patient. Disposition Plan: Home. Consults called: None. Admission status: Observation.   Eduard Clos MD [facility] Pager [phone].  If 7PM-7AM, please contact night-coverage [email or web address] Password [email or web address]  10/31/2018, 10:34 PM

## 2018-10-31 NOTE — ED Triage Notes (Signed)
Pt here with c/o epigastric pain and some pain when he takes a deep breath , pt does have a his tory of PE

## 2018-10-31 NOTE — Progress Notes (Signed)
ANTICOAGULATION CONSULT NOTE - Initial Consult  Pharmacy Consult for Heparin Indication: pulmonary embolus  No Known Allergies  Patient Measurements: Height: 5\' 8"  (172.7 cm) Weight: 157 lb (71.2 kg) IBW/kg (Calculated) : 68.4 Heparin Dosing Weight: 71.2 kg  Vital Signs: Temp: 98.9 F (37.2 C) (10/09 1531) Temp Source: Oral (10/09 1531) BP: 127/90 (10/09 2100) Pulse Rate: 91 (10/09 2100)  Labs: Recent Labs    10/31/18 1614 10/31/18 1945  HGB 15.6  --   HCT 46.8  --   PLT PLATELET CLUMPS NOTED ON SMEAR, UNABLE TO ESTIMATE  --   CREATININE 0.90  --   TROPONINIHS 5 6    Estimated Creatinine Clearance: 89.7 mL/min (by C-G formula based on SCr of 0.9 mg/dL).   Medical History: No past medical history on file.  Assessment: 67 YOM who presented on 10/9 with CP and was found to have an acute B/L PE with no evidence of RHS. Of noting - the patient has previously had a PE back in 2017 and was prescribed Xarelto at that time however stopped taking it a few months after starting per patient report. Pharmacy consulted to dose Heparin for anticoagulation.   No recent surgeries or hx CVA noted. Baseline CBC wnl. Not taking any blood thinners PTA.   Goal of Therapy:  Heparin level 0.3-0.7 units/ml Monitor platelets by anticoagulation protocol: Yes   Plan:  - Heparin 4500 unit bolus x 1 - Start Heparin at a drip rate of 1150 units/hr (11.5 ml/hr) - Will continue to monitor for any signs/symptoms of bleeding and will follow up with heparin level in 6 hours   Thank you for allowing pharmacy to be a part of this patient's care.  Alycia Rossetti, PharmD, BCPS Clinical Pharmacist Clinical phone for 10/31/2018: [phone] 10/31/2018 10:18 PM   **Pharmacist phone directory can now be found on [email or web address] ([email or web address]).  Listed under [facility].

## 2018-10-31 NOTE — ED Provider Notes (Signed)
[facility] EMERGENCY DEPARTMENT Provider Note   CSN: [id number] Arrival date & time: 10/31/18  1521     History   Chief Complaint Chief Complaint  Patient presents with   Chest Pain    HPI Shane Daniel is a 53 y.o. male.     The history is provided by the patient.  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  Patient Active Problem List   Diagnosis Date Noted   Left pulmonary embolus ([place]) 06/17/2015   Tobacco abuse 06/17/2015   COPD (chronic obstructive pulmonary disease) ([place]) 06/17/2015   Health care maintenance 04/27/2011    No past surgical history on file.      Home Medications    Prior to Admission medications   Medication Sig Start Date End Date Taking? Authorizing Provider  dicyclomine (BENTYL) 20 MG tablet Take 1 tablet (20 mg total) by mouth 2 (two) times daily. 02/18/16   Larene Pickett, PA-C  diphenoxylate-atropine (LOMOTIL) 2.5-0.025 MG tablet Take 1 tablet by mouth 4 (four) times daily as needed for diarrhea or loose stools. 02/13/16   Tanna Furry, MD  potassium chloride (K-DUR) 10 MEQ tablet Take 1 tablet (10 mEq total) by mouth 3 (three) times daily. 02/13/16   Tanna Furry, MD  rivaroxaban (XARELTO) 20 MG TABS tablet Take 1 tablet (20 mg total) by mouth daily with supper. 07/09/15   Rai, Vernelle Emerald, MD  Rivaroxaban 15 & 20 MG TBPK Take as directed on package: Start with one 15mg  tablet by mouth twice a day with food. On Day 22 (07/09/15), switch to one 20mg  tablet once a  day with food/supper. Patient not taking: Reported on 02/18/2016 06/19/15   Mendel Corning, MD    Family History No family history on file.  Social History Social History   Tobacco Use   Smoking status: Former Smoker    Packs/day: 1.00    Types: Cigarettes   Smokeless tobacco: Never Used   Tobacco comment: Stopped  since d/c from hospital.  Substance Use Topics   Alcohol use: Yes    Alcohol/week: 0.0 standard drinks    Comment: 2 beers /day   Drug use: No     Allergies   Patient has no known allergies.   Review of Systems Review of Systems  Constitutional: Negative for chills and fever.  HENT: Negative for ear pain and sore throat.   Eyes: Negative for pain and visual disturbance.  Respiratory: Negative for cough and shortness of breath.   Cardiovascular: Positive for chest pain. Negative for palpitations.  Gastrointestinal: Negative for abdominal pain and vomiting.  Genitourinary: Negative for dysuria and hematuria.  Musculoskeletal: Negative for arthralgias and back pain.  Skin: Negative for color change and rash.  Neurological: Negative for seizures and syncope.  All other systems reviewed and are negative.    Physical Exam Updated Vital Signs  ED Triage Vitals  Enc Vitals Group     BP 10/31/18 1531 132/84     Pulse Rate 10/31/18  1531 (!) 120     Resp 10/31/18 1531 18     Temp 10/31/18 1531 98.9 F (37.2 C)     Temp Source 10/31/18 1531 Oral     SpO2 10/31/18 1531 100 %     Weight --      Height --      Head Circumference --      Peak Flow --      Pain Score 10/31/18 1957 0     Pain Loc --      Pain Edu? --      Excl. in GC? --     Physical Exam Vitals signs and nursing note reviewed.  Constitutional:      General: He is not in acute distress.    Appearance: He is well-developed. He is not ill-appearing.  HENT:     Head: Normocephalic and atraumatic.  Eyes:     Conjunctiva/sclera: Conjunctivae normal.  Neck:     Musculoskeletal: Neck  supple.  Cardiovascular:     Rate and Rhythm: Normal rate and regular rhythm.     Heart sounds: Normal heart sounds. No murmur.  Pulmonary:     Effort: Pulmonary effort is normal. No respiratory distress.     Breath sounds: Normal breath sounds. No decreased breath sounds, wheezing, rhonchi or rales.  Abdominal:     Palpations: Abdomen is soft.     Tenderness: There is no abdominal tenderness.  Musculoskeletal: Normal range of motion.     Right lower leg: No edema.     Left lower leg: No edema.  Skin:    General: Skin is warm and dry.  Neurological:     General: No focal deficit present.     Mental Status: He is alert.      ED Treatments / Results  Labs (all labs ordered are listed, but only abnormal results are displayed) Labs Reviewed  BASIC METABOLIC PANEL - Abnormal; Notable for the following components:      Result Value   Potassium 3.2 (*)    Glucose, Bld 114 (*)    All other components within normal limits  D-DIMER, QUANTITATIVE (NOT AT [facility]) - Abnormal; Notable for the following components:   D-Dimer, Quant 1.80 (*)    All other components within normal limits  SARS CORONAVIRUS 2 (TAT 6-24 HRS)  CBC  TROPONIN I (HIGH SENSITIVITY)  TROPONIN I (HIGH SENSITIVITY)    EKG EKG Interpretation  Date/Time:  Friday October 31 2018 15:36:33 EDT Ventricular Rate:  121 PR Interval:  148 QRS Duration: 86 QT Interval:  322 QTC Calculation: 457 R Axis:   68 Text Interpretation:  Sinus tachycardia Otherwise normal ECG Confirmed by Virgina Norfolk [phone]) on 10/31/2018 6:59:36 PM   Radiology Dg Chest 2 View  Result Date: 10/31/2018 CLINICAL DATA:  Chest pain with deep breathing. No medical history. Former smoker. EXAM: CHEST - 2 VIEW COMPARISON:  Chest radiograph 02/13/2016 FINDINGS: The heart size and mediastinal contours are within normal limits. Minimal linear opacities at the left base likely reflect atelectasis or scarring. No focal pulmonary opacity concerning for  infection. No evidence of edema. No pneumothorax or pleural effusion. The visualized skeletal structures are unremarkable. IMPRESSION: No evidence of active disease. Minimal left basilar atelectasis or scarring. Electronically Signed   By: Emmaline Kluver M.D.   On: 10/31/2018 17:14   Ct Angio Chest Pe W And/or Wo Contrast  Result Date: 10/31/2018 CLINICAL DATA:  Chest pain. EXAM: CT ANGIOGRAPHY CHEST WITH CONTRAST TECHNIQUE: Multidetector CT imaging  of the chest was performed using the standard protocol during bolus administration of intravenous contrast. Multiplanar CT image reconstructions and MIPs were obtained to evaluate the vascular anatomy. CONTRAST:  100mL OMNIPAQUE IOHEXOL 350 MG/ML SOLN COMPARISON:  Jun 17, 2015. FINDINGS: Cardiovascular: Contrast injection is sufficient to demonstrate satisfactory opacification of the pulmonary arteries to the segmental level.There are acute bilateral segmental and subsegmental pulmonary emboli. The main pulmonary artery is dilated measuring approximately 3 cm in diameter. There is no CT evidence of acute right heart strain. The visualized aorta is normal. Heart size is mildly enlarged. There is no significant pericardial effusion. Mediastinum/Nodes: --No mediastinal or hilar lymphadenopathy. --No axillary lymphadenopathy. --No supraclavicular lymphadenopathy. --Normal thyroid gland. --The esophagus is unremarkable Lungs/Pleura: There is an airspace opacity in the lingula. There is consolidation in the right middle lobe. There is atelectasis at the lung bases. There is no pneumothorax. There is no significant pleural effusion. Upper Abdomen: There is hepatic steatosis. Musculoskeletal: No chest wall abnormality. No acute or significant osseous findings. Review of the MIP images confirms the above findings. IMPRESSION: 1. Acute bilateral segmental and subsegmental pulmonary emboli. No CT evidence for acute right heart strain. 2. Consolidation in the lingula and  right middle lobe, concerning for pulmonary infarcts versus pneumonia. 3. Hepatic steatosis. Aortic Atherosclerosis (ICD10-I70.0). These results were called by telephone at the time of interpretation on 10/31/2018 at 9:37 pm to provider Shane Daniel , who verbally acknowledged these results. Electronically Signed   By: Katherine Mantlehristopher  Green M.D.   On: 10/31/2018 21:39    Procedures .Critical Care Performed by: Virgina Norfolkuratolo, Tiwatope Emmitt, DO Authorized by: Virgina Norfolkuratolo, Jisele Price, DO   Critical care provider statement:    Critical care time (minutes):  35   Critical care was necessary to treat or prevent imminent or life-threatening deterioration of the following conditions:  Respiratory failure   Critical care was time spent personally by me on the following activities:  Blood draw for specimens, development of treatment plan with patient or surrogate, discussions with primary provider, evaluation of patient's response to treatment, examination of patient, obtaining history from patient or surrogate, ordering and performing treatments and interventions, ordering and review of laboratory studies, ordering and review of radiographic studies, pulse oximetry, re-evaluation of patient's condition and review of old charts   I assumed direction of critical care for this patient from another provider in my specialty: no     (including critical care time)  Medications Ordered in ED Medications  sodium chloride flush (NS) 0.9 % injection 3 mL (3 mLs Intravenous Given 10/31/18 1954)  iohexol (OMNIPAQUE) 350 MG/ML injection 100 mL (100 mLs Intravenous Contrast Given 10/31/18 2110)     Initial Impression / Assessment and Plan / ED Course  I have reviewed the triage vital signs and the nursing notes.  Pertinent labs & imaging results that were available during my care of the patient were reviewed by me and considered in my medical decision making (see chart for details).     Shane Daniel is a 53 year old male with history  of pulmonary embolism who presents to the ED with chest pain, shortness of breath.  EKG shows sinus tachycardia.  No ischemic changes.  Patient has history of PE but no longer on blood thinners.  Is not able to tell me why.  Has pleuritic type chest pain.  Has some mild tachypnea.  No hypoxia.  D-dimer is positive and will get PE scan to further evaluate for blood clot.  Troponin is negative x2 and  doubt ACS.  Chest x-ray without any signs of pneumonia, no pneumothorax, no pleural effusion.  No significant anemia, electrolyte abnormality, kidney injury.  Awaiting CT scan of the chest to rule out PE or other pulmonary problem.  Radiology called to discussed that patient has bilateral PEs however no right heart strain.  Possible lung infarction.  Will start patient on heparin and admit for further acute pulmonary embolism.  Hemodynamically stable throughout my care.  Tachycardia improved throughout my care.  This chart was dictated using voice recognition software.  Despite best efforts to proofread,  errors can occur which can change the documentation meaning.    Final Clinical Impressions(s) / ED Diagnoses   Final diagnoses:  Other acute pulmonary embolism without acute cor pulmonale [facility])    ED Discharge Orders    None       Virgina Norfolk, DO 10/31/18 2208

## 2018-11-01 ENCOUNTER — Observation Stay (HOSPITAL_COMMUNITY): Payer: 59

## 2018-11-01 ENCOUNTER — Encounter (HOSPITAL_COMMUNITY): Payer: Self-pay | Admitting: Internal Medicine

## 2018-11-01 DIAGNOSIS — I2699 Other pulmonary embolism without acute cor pulmonale: Secondary | ICD-10-CM | POA: Diagnosis not present

## 2018-11-01 LAB — CBC
HCT: 43.8 % (ref 39.0–52.0)
Hemoglobin: 15.5 g/dL (ref 13.0–17.0)
MCH: 34.2 pg — ABNORMAL HIGH (ref 26.0–34.0)
MCHC: 35.4 g/dL (ref 30.0–36.0)
MCV: 96.7 fL (ref 80.0–100.0)
Platelets: 129 10*3/uL — ABNORMAL LOW (ref 150–400)
RBC: 4.53 MIL/uL (ref 4.22–5.81)
RDW: 12.4 % (ref 11.5–15.5)
WBC: 8.2 10*3/uL (ref 4.0–10.5)
nRBC: 0 % (ref 0.0–0.2)

## 2018-11-01 LAB — HIV ANTIBODY (ROUTINE TESTING W REFLEX): HIV Screen 4th Generation wRfx: NONREACTIVE

## 2018-11-01 LAB — SARS CORONAVIRUS 2 (TAT 6-24 HRS): SARS Coronavirus 2: NEGATIVE

## 2018-11-01 LAB — HEPARIN LEVEL (UNFRACTIONATED): Heparin Unfractionated: 0.75 IU/mL — ABNORMAL HIGH (ref 0.30–0.70)

## 2018-11-01 MED ORDER — POTASSIUM CHLORIDE CRYS ER 20 MEQ PO TBCR
40.0000 meq | EXTENDED_RELEASE_TABLET | Freq: Once | ORAL | Status: AC
  Administered 2018-11-01: 40 meq via ORAL

## 2018-11-01 MED ORDER — RIVAROXABAN 20 MG PO TABS: 20.0000 mg | ORAL_TABLET | Freq: Every day | ORAL | Status: DC

## 2018-11-01 MED ORDER — RIVAROXABAN (XARELTO) VTE STARTER PACK (15 & 20 MG): ORAL_TABLET | ORAL | 0 refills | Status: AC

## 2018-11-01 MED ORDER — RIVAROXABAN 15 MG PO TABS
15.0000 mg | ORAL_TABLET | Freq: Two times a day (BID) | ORAL | Status: DC
  Administered 2018-11-01: 15 mg via ORAL
  Filled 2018-11-01 (×2): qty 1

## 2018-11-01 NOTE — Discharge Instructions (Signed)
Information on my medicine - XARELTO (rivaroxaban)  This medication education was reviewed with me or my healthcare representative as part of my discharge preparation.  WHY WAS XARELTO PRESCRIBED FOR YOU? Xarelto was prescribed to treat blood clots that may have been found in the veins of your legs (deep vein thrombosis) or in your lungs (pulmonary embolism) and to reduce the risk of them occurring again.  What do you need to know about Xarelto? The starting dose is one 15 mg tablet taken TWICE daily with food for the FIRST 21 DAYS then on (enter date)  11/22/18  the dose is changed to one 20 mg tablet taken ONCE A DAY with your evening meal.  DO NOT stop taking Xarelto without talking to the health care provider who prescribed the medication.  Refill your prescription for 20 mg tablets before you run out.  After discharge, you should have regular check-up appointments with your healthcare provider that is prescribing your Xarelto.  In the future your dose may need to be changed if your kidney function changes by a significant amount.  What do you do if you miss a dose? If you are taking Xarelto TWICE DAILY and you miss a dose, take it as soon as you remember. You may take two 15 mg tablets (total 30 mg) at the same time then resume your regularly scheduled 15 mg twice daily the next day.  If you are taking Xarelto ONCE DAILY and you miss a dose, take it as soon as you remember on the same day then continue your regularly scheduled once daily regimen the next day. Do not take two doses of Xarelto at the same time.   Important Safety Information Xarelto is a blood thinner medicine that can cause bleeding. You should call your healthcare provider right away if you experience any of the following: ? Bleeding from an injury or your nose that does not stop. ? Unusual colored urine (red or dark brown) or unusual colored stools (red or black). ? Unusual bruising for unknown reasons. ? A  serious fall or if you hit your head (even if there is no bleeding).  Some medicines may interact with Xarelto and might increase your risk of bleeding while on Xarelto. To help avoid this, consult your healthcare provider or pharmacist prior to using any new prescription or non-prescription medications, including herbals, vitamins, non-steroidal anti-inflammatory drugs (NSAIDs) and supplements.  This website has more information on Xarelto: [email or web address]

## 2018-11-01 NOTE — ED Notes (Signed)
Patient verbalizes understanding of discharge instructions . Opportunity for questions and answers were provided . Armband removed by staff ,Pt discharged from ED. W/C  offered at D/C  and Declined W/C at D/C and was escorted to lobby by RN.  

## 2018-11-01 NOTE — Progress Notes (Addendum)
TOC CM sent Xarelto 30 day free trial card coupon to his [facility]. [facility] do have medication in stock. Pt does not have PCP. Will follow up with patient to assist with appt. Jonnie Finner RN CCM, [facility] ED TOC CM [phone]  Spoke to pharmacy and pt has used Xarelto free trial card in the past. He will have to utilize $10 copay card. His copay with insurance is $35. Jonnie Finner RN [place], [place] ED TOC CM [phone]

## 2018-11-01 NOTE — Progress Notes (Signed)
ANTICOAGULATION CONSULT NOTE  Pharmacy Consult for Heparin Indication: pulmonary embolus  No Known Allergies  Patient Measurements: Height: 5\' 8"  (172.7 cm) Weight: 157 lb (71.2 kg) IBW/kg (Calculated) : 68.4 Heparin Dosing Weight: 71.2 kg  Vital Signs: BP: 122/85 (10/10 0015) Pulse Rate: 92 (10/10 0015)  Labs: Recent Labs    10/31/18 1614 10/31/18 1945 11/01/18 0316  HGB 15.6  --  15.5  HCT 46.8  --  43.8  PLT PLATELET CLUMPS NOTED ON SMEAR, UNABLE TO ESTIMATE  --  129*  HEPARINUNFRC  --   --  0.75*  CREATININE 0.90  --   --   TROPONINIHS 5 6  --     Estimated Creatinine Clearance: 89.7 mL/min (by C-G formula based on SCr of 0.9 mg/dL).   Medical History: History reviewed. No pertinent past medical history.  Assessment: 53 y.o. male with PE for heparin .  Heparin level slightly supratherapeutic.  Goal of Therapy:  Heparin level 0.3-0.7 units/ml Monitor platelets by anticoagulation protocol: Yes   Plan:  Heparin may continue decrease after initial bolus, so will continue Heparin at current rate  Recheck level in 6 hours and adjust rate if level remains elevated  Phillis Knack, PharmD, BCPS

## 2018-11-01 NOTE — Discharge Summary (Signed)
Physician Discharge Summary  Shane Daniel:[id number] DOB: 11-Mar-1965 DOA: 10/31/2018  PCP: Patient, No Pcp Per-- has upcoming appointment with PCP-- doesn't remember the name  Admit date: 10/31/2018 Discharge date: 11/01/2018  Admitted From: home Discharge disposition: home   Recommendations for Outpatient Follow-Up:   1. 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  Diet recommendation: Low sodium, heart healthy  Wound care: None.  Code status: Full.   History of Present Illness:   Shane Daniel is a 53 y.o. male with history of pulmonary embolism in 2017 at that time blood work showed lupus anticoagulant being positive patient was on anticoagulation for 3 months presents to the ER with complaints of having pleuritic type of chest pain over the last 3 days.  Patient states pain is across the chest with no associated shortness of breath productive cough fever or chills.  Denies noticing any lower extremity edema.  Has not had any recent travel.  Has family history of having DVT in his dad.   Hospital Course by Problem:   Acute bilateral segmental and subsegmental pulmonary bolus him with no strain pattern noticed  -patient is hemodynamically stable.   -Pulmonary embolism was unprovoked.   -Blood work done in 2017 showed lupus anticoagulant being positive.  Patient also has a family history of patient's dad having recurrent DVTs.   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  Mild hypokalemia - replace     Medical Consultants:      Discharge Exam:   Vitals:   10/31/18 2330 11/01/18 0015  BP: 124/90 122/85  Pulse:  92  Resp: 17 (!) 23  Temp:    SpO2:  94%   Vitals:   10/31/18 2200 10/31/18 2245 10/31/18 2330 11/01/18 0015  BP: 139/83  126/87 124/90 122/85  Pulse: 91 100  92  Resp: 14 16 17  (!) 23  Temp:      TempSrc:      SpO2: 99% 99%  94%  Weight: 71.2 kg     Height: 5\' 8"  (1.727 m)       General exam: Appears calm and comfortable.  Wants to go home  The results of significant diagnostics from this hospitalization (including imaging, microbiology, ancillary and laboratory) are listed below for reference.     Procedures and Diagnostic Studies:   Dg Chest 2 View  Result Date: 10/31/2018 CLINICAL DATA:  Chest pain with deep breathing. No medical history. Former smoker. EXAM: CHEST - 2 VIEW COMPARISON:  Chest radiograph 02/13/2016 FINDINGS: The heart size and mediastinal contours are within normal limits. Minimal linear opacities at the left base likely reflect atelectasis or scarring. No focal pulmonary opacity concerning for infection. No evidence of edema. No pneumothorax or pleural effusion. The visualized skeletal structures are unremarkable. IMPRESSION: No evidence of active disease. Minimal left basilar atelectasis or scarring. Electronically Signed   By: Audie Pinto M.D.   On: 10/31/2018 17:14   Ct Angio Chest Pe W And/or Wo Contrast  Result Date: 10/31/2018 CLINICAL DATA:  Chest pain. EXAM: CT ANGIOGRAPHY CHEST WITH CONTRAST TECHNIQUE: Multidetector CT imaging of the chest was performed using the standard protocol during bolus administration of intravenous contrast. Multiplanar CT image reconstructions and MIPs were obtained to evaluate the vascular anatomy. CONTRAST:  128mL OMNIPAQUE IOHEXOL  350 MG/ML SOLN COMPARISON:  Jun 17, 2015. FINDINGS: Cardiovascular: Contrast injection is sufficient to demonstrate satisfactory opacification of the pulmonary arteries to the segmental level.There are acute bilateral segmental and subsegmental pulmonary emboli. The main pulmonary artery is dilated measuring approximately 3 cm in diameter. There is no CT evidence of acute right heart strain. The visualized aorta is normal.  Heart size is mildly enlarged. There is no significant pericardial effusion. Mediastinum/Nodes: --No mediastinal or hilar lymphadenopathy. --No axillary lymphadenopathy. --No supraclavicular lymphadenopathy. --Normal thyroid gland. --The esophagus is unremarkable Lungs/Pleura: There is an airspace opacity in the lingula. There is consolidation in the right middle lobe. There is atelectasis at the lung bases. There is no pneumothorax. There is no significant pleural effusion. Upper Abdomen: There is hepatic steatosis. Musculoskeletal: No chest wall abnormality. No acute or significant osseous findings. Review of the MIP images confirms the above findings. IMPRESSION: 1. Acute bilateral segmental and subsegmental pulmonary emboli. No CT evidence for acute right heart strain. 2. Consolidation in the lingula and right middle lobe, concerning for pulmonary infarcts versus pneumonia. 3. Hepatic steatosis. Aortic Atherosclerosis (ICD10-I70.0). These results were called by telephone at the time of interpretation on 10/31/2018 at 9:37 pm to provider ADAM CURATOLO , who verbally acknowledged these results. Electronically Signed   By: Katherine Mantle M.D.   On: 10/31/2018 21:39     Labs:   Basic Metabolic Panel: Recent Labs  Lab 10/31/18 1614  NA 137  K 3.2*  CL 101  CO2 27  GLUCOSE 114*  BUN 7  CREATININE 0.90  CALCIUM 8.9   GFR Estimated Creatinine Clearance: 89.7 mL/min (by C-G formula based on SCr of 0.9 mg/dL). Liver Function Tests: No results for input(s): AST, ALT, ALKPHOS, BILITOT, PROT, ALBUMIN in the last 168 hours. No results for input(s): LIPASE, AMYLASE in the last 168 hours. No results for input(s): AMMONIA in the last 168 hours. Coagulation profile No results for input(s): INR, PROTIME in the last 168 hours.  CBC: Recent Labs  Lab 10/31/18 1614 11/01/18 0316  WBC 9.3 8.2  HGB 15.6 15.5  HCT 46.8 43.8  MCV 99.4 96.7  PLT PLATELET CLUMPS NOTED ON SMEAR, UNABLE TO ESTIMATE  129*   Cardiac Enzymes: No results for input(s): CKTOTAL, CKMB, CKMBINDEX, TROPONINI in the last 168 hours. BNP: Invalid input(s): POCBNP CBG: No results for input(s): GLUCAP in the last 168 hours. D-Dimer Recent Labs    10/31/18 1945  DDIMER 1.80*   Hgb A1c No results for input(s): HGBA1C in the last 72 hours. Lipid Profile No results for input(s): CHOL, HDL, LDLCALC, TRIG, CHOLHDL, LDLDIRECT in the last 72 hours. Thyroid function studies No results for input(s): TSH, T4TOTAL, T3FREE, THYROIDAB in the last 72 hours.  Invalid input(s): FREET3 Anemia work up No results for input(s): VITAMINB12, FOLATE, FERRITIN, TIBC, IRON, RETICCTPCT in the last 72 hours. Microbiology Recent Results (from the past 240 hour(s))  SARS CORONAVIRUS 2 (TAT 6-24 HRS) Nasopharyngeal Nasopharyngeal Swab     Status: None   Collection Time: 10/31/18  9:58 PM   Specimen: Nasopharyngeal Swab  Result Value Ref Range Status   SARS Coronavirus 2 NEGATIVE NEGATIVE Final    Comment: (NOTE) SARS-CoV-2 target nucleic acids are NOT DETECTED. The SARS-CoV-2 RNA is generally detectable in upper and lower respiratory specimens during the acute phase of infection. Negative results do not preclude SARS-CoV-2 infection, do not rule out co-infections with other pathogens, and should not be used as the sole basis for treatment or other patient management  decisions. Negative results must be combined with clinical observations, patient history, and epidemiological information. The expected result is Negative. Fact Sheet for Patients: HairSlick.nohttps://www.fda.gov/media/138098/download Fact Sheet for Healthcare Providers: quierodirigir.comhttps://www.fda.gov/media/138095/download This test is not yet approved or cleared by the [place] FDA and  has been authorized for detection and/or diagnosis of SARS-CoV-2 by FDA under an Emergency Use Authorization (EUA). This EUA will remain  in effect (meaning this test can be used) for the duration  of the COVID-19 declaration under Section 56 4(b)(1) of the Act, 21 U.S.C. section 360bbb-3(b)(1), unless the authorization is terminated or revoked sooner. Performed at [facility], [address] [place], [place] [address]      Discharge Instructions:   Discharge Instructions    Diet - low sodium heart healthy   Complete by: As directed    Increase activity slowly   Complete by: As directed      Allergies as of 11/01/2018   No Known Allergies     Medication List    STOP taking these medications   dicyclomine 20 MG tablet Commonly known as: BENTYL   diphenoxylate-atropine 2.5-0.025 MG tablet Commonly known as: Lomotil   potassium chloride 10 MEQ tablet Commonly known as: KLOR-CON     TAKE these medications   Restasis 0.05 % ophthalmic emulsion Generic drug: cycloSPORINE Place 1 drop into both eyes 2 (two) times daily.   Rivaroxaban 15 & 20 MG Tbpk Follow package directions: Take one 15mg  tablet by mouth twice a day. On day 22, switch to one 20mg  tablet once a day. Take with food. What changed:   additional instructions  Another medication with the same name was removed. Continue taking this medication, and follow the directions you see here.      Follow-up Information    follow up with your PCP as previously scheduled Follow up.            Time coordinating discharge: 25 min  Signed:  Joseph ArtJessica U Hitesh Fouche DO  [facility] 11/01/2018, 9:27 AM

## 2018-11-01 NOTE — Progress Notes (Signed)
[place] >> Xarelto Indication: pulmonary embolus  No Known Allergies  Patient Measurements: Height: 5\' 8"  (172.7 cm) Weight: 157 lb (71.2 kg) IBW/kg (Calculated) : 68.4 Heparin Dosing Weight: 71.2 kg  Vital Signs: BP: 122/85 (10/10 0015) Pulse Rate: 92 (10/10 0015)  Labs: Recent Labs    10/31/18 1614 10/31/18 1945 11/01/18 0316  HGB 15.6  --  15.5  HCT 46.8  --  43.8  PLT PLATELET CLUMPS NOTED ON SMEAR, UNABLE TO ESTIMATE  --  129*  HEPARINUNFRC  --   --  0.75*  CREATININE 0.90  --   --   TROPONINIHS 5 6  --     Estimated Creatinine Clearance: 89.7 mL/min (by C-G formula based on SCr of 0.9 mg/dL).   Assessment: 53 y.o. male presented with chest pain, found to have bilateral PEs.  Patient has a history of PE and lupus anticoagulant in 2017.  He was not on anticoagulation PTA.  Pharmacy consulted to transition patient from IV heparin to Xarelto.  Patient has mild thrombocytopenia; no bleeding documented.  Goal of Therapy:  Heparin level 0.3-0.7 units/ml Monitor platelets by anticoagulation protocol: Yes   Plan:  Stop IV heparin when Xarelto is administered Xarelto 15mg  PO BIDWM x 21 days, then on 11/22/18 start 20mg  daily with supper Pharmacy will sign off and follow peripherally.  Thank you for the consult!  Leticia Coletta D. Mina Marble, PharmD, BCPS, [place] 11/01/2018, 9:06 AM

## 2018-11-01 NOTE — Progress Notes (Signed)
Attempted to d/c duplex but unable to do.  No pain/swelling.  Will not change plan.   Eulogio Bear DO

## 2018-11-11 ENCOUNTER — Other Ambulatory Visit: Payer: Self-pay

## 2018-11-11 DIAGNOSIS — Z20822 Contact with and (suspected) exposure to covid-19: Secondary | ICD-10-CM

## 2018-11-12 ENCOUNTER — Telehealth: Payer: Self-pay | Admitting: *Deleted

## 2018-11-12 LAB — NOVEL CORONAVIRUS, NAA: SARS-CoV-2, NAA: NOT DETECTED

## 2018-11-12 NOTE — Telephone Encounter (Signed)
TOC CM spoke to pt and states he does have PCP now, but did not have name. Will call CM with information after he gets off work. Mailed pt his Xarelto $10 copay card. [place], [place] ED TOC CM [phone]

## 2018-11-14 ENCOUNTER — Telehealth: Payer: Self-pay | Admitting: *Deleted

## 2018-11-14 NOTE — Telephone Encounter (Signed)
Pt reports his new PCP, Dr Lauretta Grill Karenann Cai at [facility] [place]. He has an upcoming appt. Mailed his Xarelto $10 copay to his home per his permission. [name], [name] ED TOC CM [phone]

## 2021-04-01 IMAGING — CT CT ANGIO CHEST
2 of 6 series · 18 of 36 positions shown · IV contrast (omnipaque)
Comparison: June 17, 2015.

CLINICAL DATA: Chest pain.

EXAM:
CT ANGIOGRAPHY CHEST WITH CONTRAST
TECHNIQUE: Multidetector CT imaging of the chest was performed using the
standard protocol during bolus administration of intravenous
contrast. Multiplanar CT image reconstructions and MIPs were
obtained to evaluate the vascular anatomy.
CONTRAST:  100mL OMNIPAQUE IOHEXOL 350 MG/ML SOLN

[Series 7: pe thins · axial · 0.73mm/px · z∈[-388,-88]mm · 17 of 477 slices shown]
[im 24/477  lung]
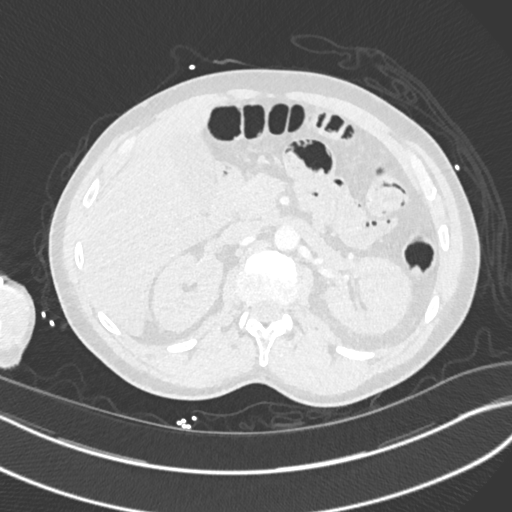
[im 48/477  mediastinal]
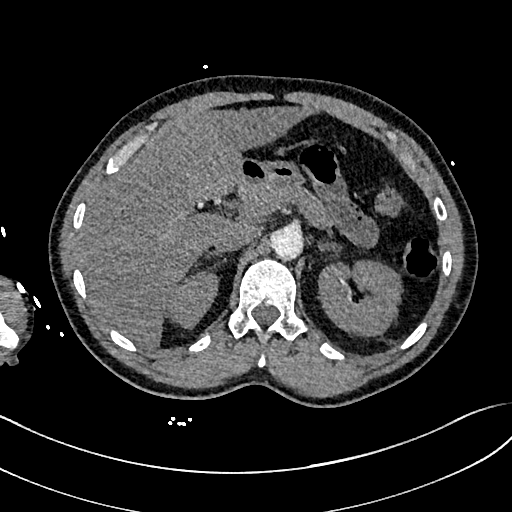
[im 72/477  lung]
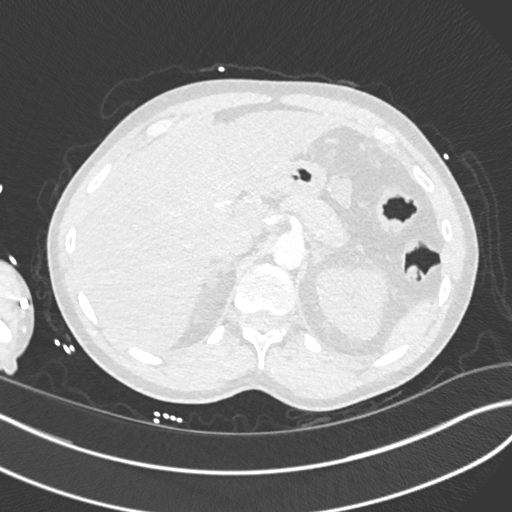
[im 96/477  mediastinal]
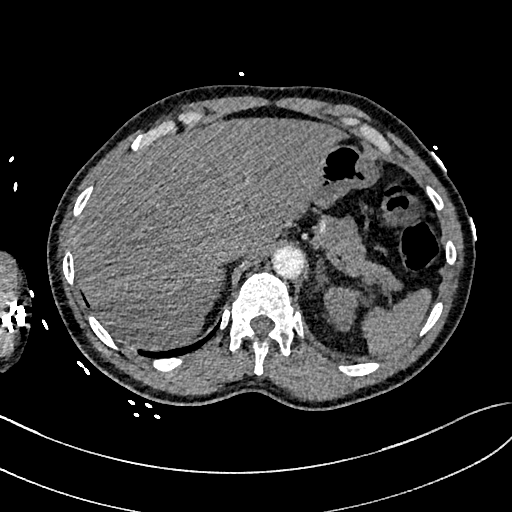
[im 143/477  lung]
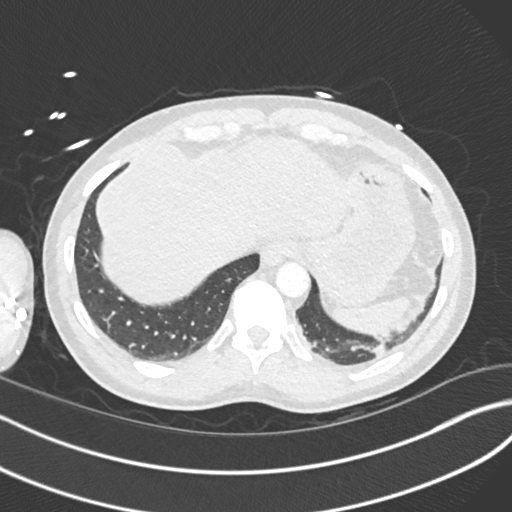
[im 167/477  mediastinal]
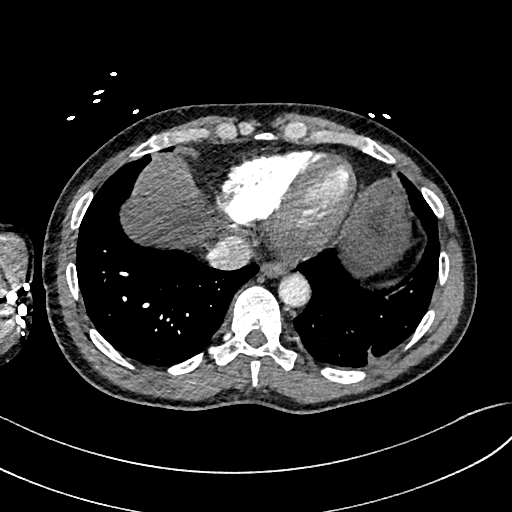
[im 191/477  lung]
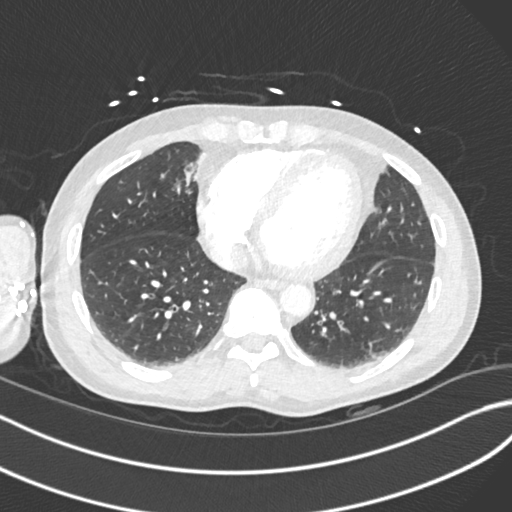
[im 215/477  mediastinal]
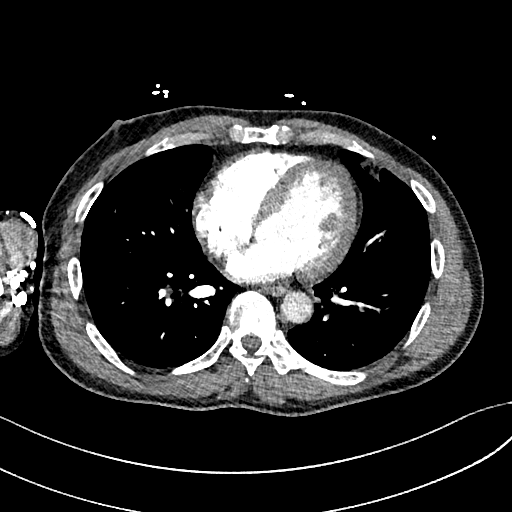
[im 239/477  lung]
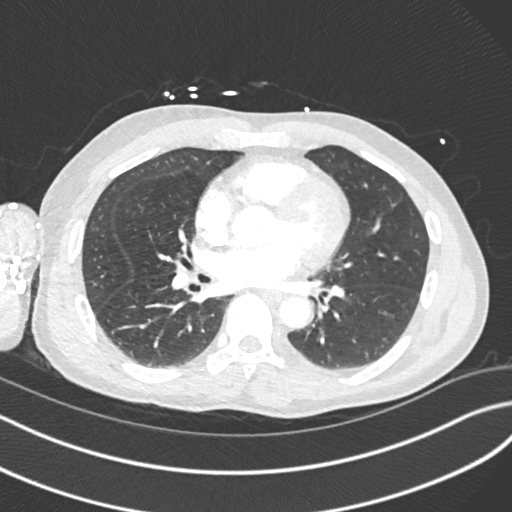
[im 262/477  mediastinal]
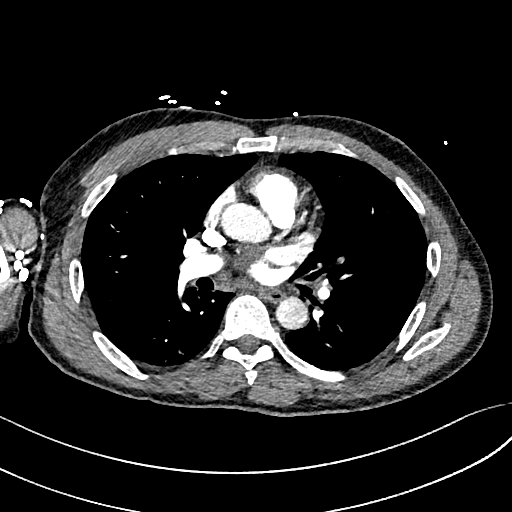
[im 286/477  lung]
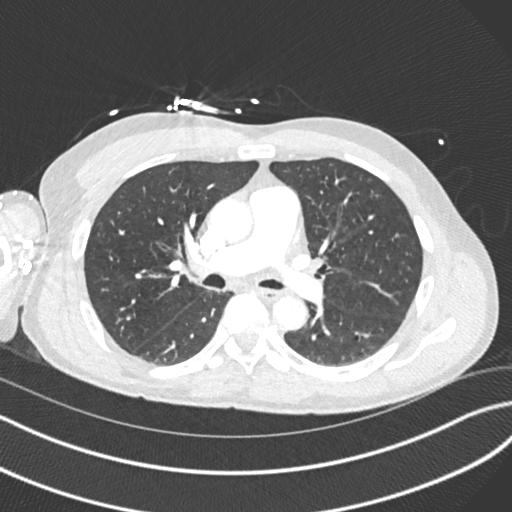
[im 310/477  mediastinal]
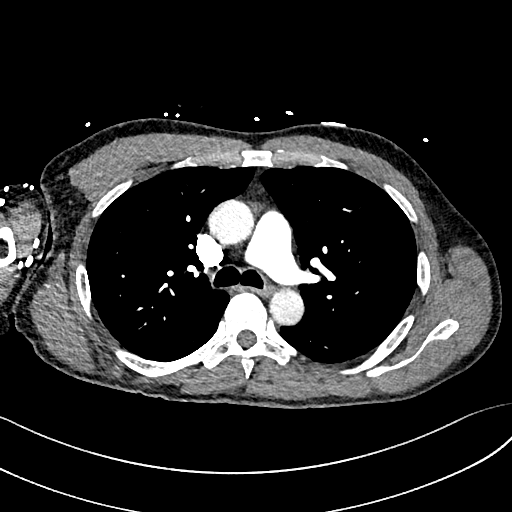
[im 334/477  lung]
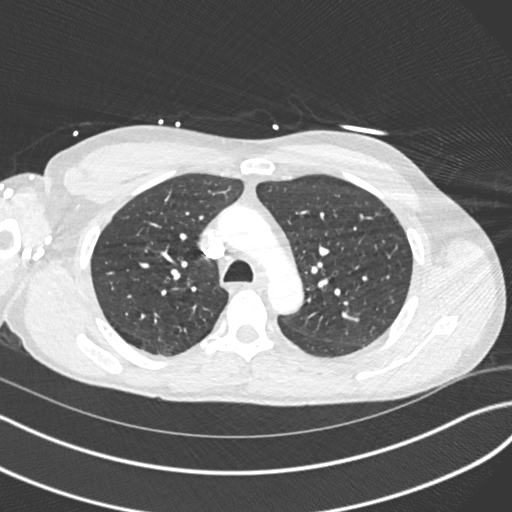
[im 381/477  mediastinal]
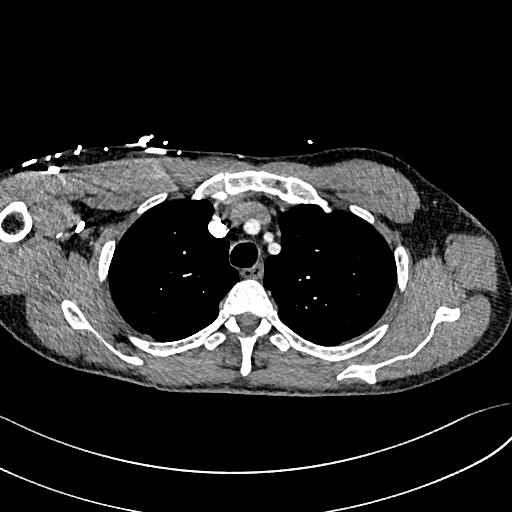
[im 405/477  lung]
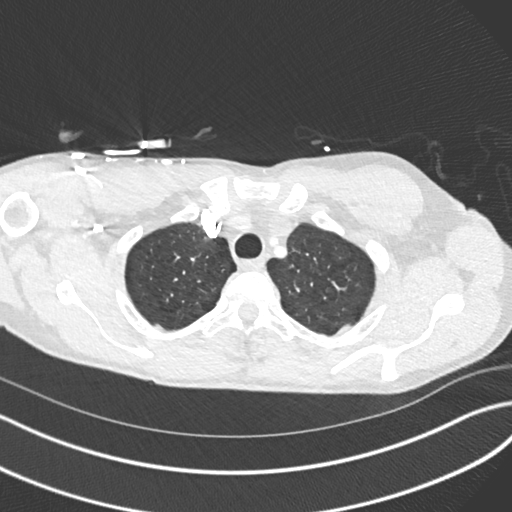
[im 429/477  mediastinal]
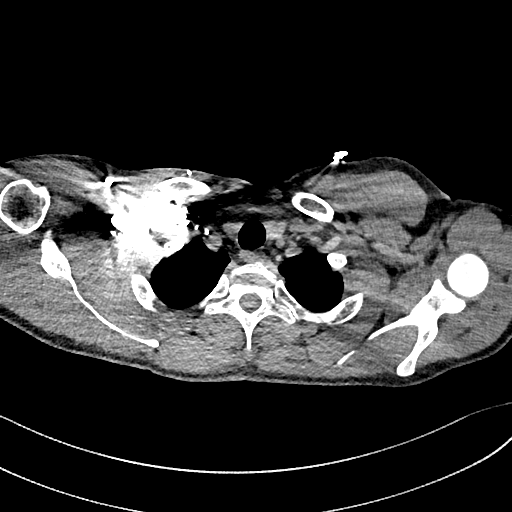
[im 453/477  lung]
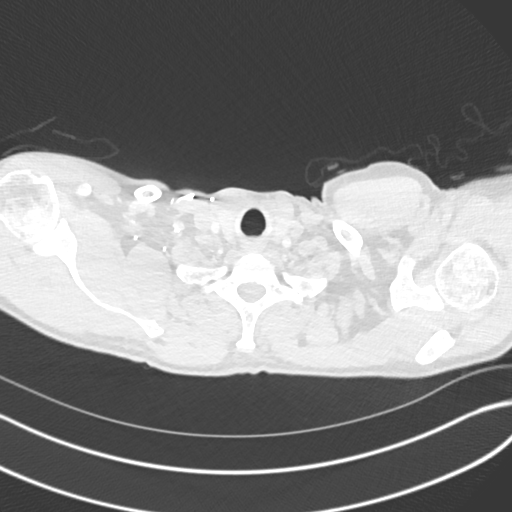

[Series 8: pe 2mm cor · coronal · 0.65mm/px · 1 of 150 slices shown]
[im 75/150  mediastinal]
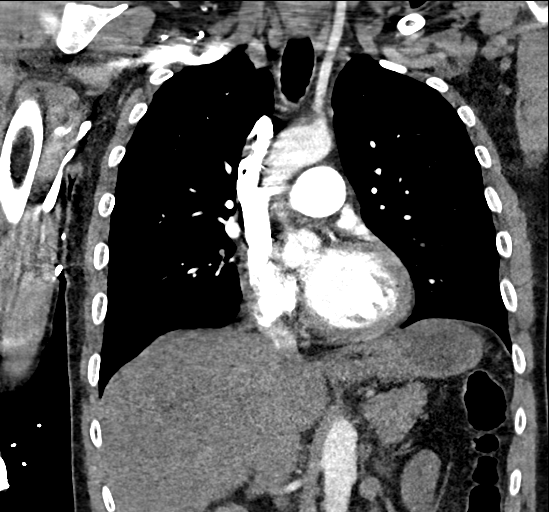

[18 of 36 positions shown; findings below may reference images not displayed]

FINDINGS: Cardiovascular: Contrast injection is sufficient to demonstrate
satisfactory opacification of the pulmonary arteries to the
segmental level.There are acute bilateral segmental and subsegmental
pulmonary emboli. The main pulmonary artery is dilated measuring
approximately 3 cm in diameter. There is no CT evidence of acute
right heart strain. The visualized aorta is normal. Heart size is
mildly enlarged. There is no significant pericardial effusion.

Mediastinum/Nodes:

--No mediastinal or hilar lymphadenopathy.

--No axillary lymphadenopathy.

--No supraclavicular lymphadenopathy.

--Normal thyroid gland.

--The esophagus is unremarkable

Lungs/Pleura: There is an airspace opacity in the lingula. There is
consolidation in the right middle lobe. There is atelectasis at the
lung bases. There is no pneumothorax. There is no significant
pleural effusion.

Upper Abdomen: There is hepatic steatosis.

Musculoskeletal: No chest wall abnormality. No acute or significant
osseous findings.

Review of the MIP images confirms the above findings.
IMPRESSION: 1. Acute bilateral segmental and subsegmental pulmonary emboli. No
CT evidence for acute right heart strain.
2. Consolidation in the lingula and right middle lobe, concerning
for pulmonary infarcts versus pneumonia.
3. Hepatic steatosis.

Aortic Atherosclerosis (35L85-43Y.Y).

These results were called by telephone at the time of interpretation
on 10/31/2018 at [DATE] to provider ANAYELI CARLSEN , who verbally
acknowledged these results.

## 2022-12-14 ENCOUNTER — Encounter: Payer: Self-pay | Admitting: *Deleted

## 2023-03-08 ENCOUNTER — Encounter: Payer: Self-pay | Admitting: Emergency Medicine

## 2024-01-31 ENCOUNTER — Telehealth: Payer: Self-pay | Admitting: Acute Care

## 2024-01-31 DIAGNOSIS — Z122 Encounter for screening for malignant neoplasm of respiratory organs: Secondary | ICD-10-CM

## 2024-01-31 DIAGNOSIS — F17210 Nicotine dependence, cigarettes, uncomplicated: Secondary | ICD-10-CM

## 2024-01-31 DIAGNOSIS — Z87891 Personal history of nicotine dependence: Secondary | ICD-10-CM

## 2024-01-31 NOTE — Telephone Encounter (Signed)
 Lung Cancer Screening Narrative/Criteria Questionnaire (Cigarette Smokers Only- No Cigars/Pipes/vapes)   Real Cona Thielman   SDMV:02/06/24@0830 [place]                                           July 31, 1965                LDCT: 02/12/24@1030a  [facility] [address]    58 y.o.   Phone: [phone]  Lung Screening Narrative (confirm age 52-77 yrs Medicare / 50-80 yrs Private pay insurance)   Insurance information:[employer]   Referring Provider:[place]   This screening involves an initial phone call with a team member from our program. It is called a shared decision making visit. The initial meeting is required by insurance and Medicare to make sure you understand the program. This appointment takes about 15-20 minutes to complete. The CT scan will completed at a separate date/time. This scan takes about 5-10 minutes to complete and you may eat and drink before and after the scan.  Criteria questions for Lung Cancer Screening:   Are you a current or former smoker? Current Age began smoking: 23y   If you are a former smoker, what year did you quit smoking? Current but quit once 73yrs   To calculate your smoking history, I need an accurate estimate of how many packs of cigarettes you smoked per day and for how many years. (Not just the number of PPD you are now smoking)   Years smoking 38 x Packs per day 1/2 to 1 = Pack years 28   (at least 20 pack yrs)   (Make sure they understand that we need to know how much they have smoked in the past, not just the number of PPD they are smoking now)  Do you have a personal history of cancer?  No    Do you have a family history of cancer? No  Are you coughing up blood?  No  Have you had unexplained weight loss of 15 lbs or more in the last 6 months? No  It looks like you meet all criteria.     Additional information: N/A

## 2024-02-06 ENCOUNTER — Encounter

## 2024-02-06 NOTE — Telephone Encounter (Signed)
 Patient called back in and rescheduled SDMV and LDCT.  SDMV: 02/13/2024 at 10:00 with Natalie  LDCT: 02/18/2024 8:30a GI

## 2024-02-12 ENCOUNTER — Other Ambulatory Visit

## 2024-02-13 ENCOUNTER — Encounter: Payer: Self-pay | Admitting: *Deleted

## 2024-02-13 ENCOUNTER — Ambulatory Visit: Admitting: *Deleted

## 2024-02-13 DIAGNOSIS — F17210 Nicotine dependence, cigarettes, uncomplicated: Secondary | ICD-10-CM

## 2024-02-13 NOTE — Patient Instructions (Signed)

## 2024-02-13 NOTE — Progress Notes (Signed)
 Virtual Visit via Telephone Note  I connected with Shane Daniel on 02/13/24 at 10:00 AM EST by telephone and verified that I am speaking with the correct person using two identifiers.  Location: Patient: at home Provider: [address] [place], [place], [address]    I discussed the limitations, risks, security and privacy concerns of performing an evaluation and management service by telephone and the availability of in person appointments. I also discussed with the patient that there may be a patient responsible charge related to this service. The patient expressed understanding and agreed to proceed.     Shared Decision Making Visit Lung Cancer Screening Program [id number])   Eligibility: Age 59 y.o. Pack Years Smoking History Calculation 28 (# packs/per year x # years smoked) Recent History of coughing up blood  no Unexplained weight loss? no ( >Than 15 pounds within the last 6 months ) Prior History Lung / other cancer no (Diagnosis within the last 5 years already requiring surveillance chest CT Scans). Smoking Status Current Smoker Former Smokers: Years since quit: n/a  Quit Date: n/a  Visit Components: Discussion included one or more decision making aids. yes Discussion included risk/benefits of screening. yes Discussion included potential follow up diagnostic testing for abnormal scans. yes Discussion included meaning and risk of over diagnosis. yes Discussion included meaning and risk of False Positives. yes Discussion included meaning of total radiation exposure. yes  Counseling Included: Importance of adherence to annual lung cancer LDCT screening. yes Impact of comorbidities on ability to participate in the program. yes Ability and willingness to under diagnostic treatment. yes  Smoking Cessation Counseling: Current Smokers:  Discussed importance of smoking cessation. yes Information about tobacco cessation classes and interventions provided to  patient. yes Patient provided with ticket for LDCT Scan. no Symptomatic Patient. no  Counseling(Intermediate counseling: > three minutes) [id number] Diagnosis Code: Tobacco Use Z72.0 Asymptomatic Patient yes  Smoking/Tobacco Cessation Counseling Shane Daniel is a current user of tobacco or nicotine  products. He is considering quitting at this time. Counseling provided today addressed the risks of continued use and the benefits of cessation. Discussed tobacco/nicotine  use history, readiness to quit, and evidence-based treatment options including behavioral strategies, support resources, and pharmacologic therapies. Provided encouragement and educational materials on steps and resources to quit smoking. Patient questions were addressed, and follow-up recommended for continued support. Total time spent on counseling: 4 minutes.   Former Smokers:  Discussed the importance of maintaining cigarette abstinence. yes Diagnosis Code: Personal History of Nicotine  Dependence. S12.108 Information about tobacco cessation classes and interventions provided to patient. Yes Patient provided with ticket for LDCT Scan. no Written Order for Lung Cancer Screening with LDCT placed in Epic. Yes (CT Chest Lung Cancer Screening Low Dose W/O CM) [id number] Z12.2-Screening of respiratory organs Z87.891-Personal history of nicotine  dependence   Laneta Speaks, RN

## 2024-02-18 ENCOUNTER — Ambulatory Visit
Admission: RE | Admit: 2024-02-18 | Discharge: 2024-02-18 | Disposition: A | Source: Ambulatory Visit | Attending: Acute Care | Admitting: Acute Care

## 2024-02-18 DIAGNOSIS — Z87891 Personal history of nicotine dependence: Secondary | ICD-10-CM

## 2024-02-18 DIAGNOSIS — Z122 Encounter for screening for malignant neoplasm of respiratory organs: Secondary | ICD-10-CM

## 2024-02-18 DIAGNOSIS — F17210 Nicotine dependence, cigarettes, uncomplicated: Secondary | ICD-10-CM

## 2024-02-24 ENCOUNTER — Other Ambulatory Visit: Payer: Self-pay

## 2024-02-24 ENCOUNTER — Telehealth: Payer: Self-pay

## 2024-02-24 DIAGNOSIS — F17210 Nicotine dependence, cigarettes, uncomplicated: Secondary | ICD-10-CM

## 2024-02-24 DIAGNOSIS — R911 Solitary pulmonary nodule: Secondary | ICD-10-CM

## 2024-02-24 DIAGNOSIS — Z122 Encounter for screening for malignant neoplasm of respiratory organs: Secondary | ICD-10-CM

## 2024-02-24 DIAGNOSIS — Z87891 Personal history of nicotine dependence: Secondary | ICD-10-CM

## 2024-08-18 ENCOUNTER — Other Ambulatory Visit
